# Patient Record
Sex: Female | Born: 1985 | Race: Black or African American | Hispanic: No | Marital: Single | Smoking: Never smoker
Health system: Southern US, Community
[De-identification: ages and names within clinical notes are randomized; demographics above are authoritative.]

## PROBLEM LIST (undated history)

## (undated) DIAGNOSIS — F32A Depression, unspecified: Secondary | ICD-10-CM

## (undated) DIAGNOSIS — R7303 Prediabetes: Secondary | ICD-10-CM

## (undated) DIAGNOSIS — Z9109 Other allergy status, other than to drugs and biological substances: Secondary | ICD-10-CM

## (undated) DIAGNOSIS — F329 Major depressive disorder, single episode, unspecified: Secondary | ICD-10-CM

## (undated) DIAGNOSIS — G4730 Sleep apnea, unspecified: Secondary | ICD-10-CM

## (undated) HISTORY — PX: LEG SURGERY: SHX1003

## (undated) HISTORY — PX: TOOTH EXTRACTION: SUR596

## (undated) HISTORY — DX: Sleep apnea, unspecified: G47.30

---
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
## 2006-03-09 ENCOUNTER — Emergency Department (HOSPITAL_COMMUNITY): Admission: EM | Admit: 2006-03-09 | Discharge: 2006-03-09 | Payer: Self-pay | Admitting: Emergency Medicine

## 2006-11-21 ENCOUNTER — Inpatient Hospital Stay (HOSPITAL_COMMUNITY): Admission: AD | Admit: 2006-11-21 | Discharge: 2006-11-21 | Payer: Self-pay | Admitting: Obstetrics

## 2007-02-18 ENCOUNTER — Inpatient Hospital Stay (HOSPITAL_COMMUNITY): Admission: AD | Admit: 2007-02-18 | Discharge: 2007-02-24 | Payer: Self-pay | Admitting: Obstetrics

## 2007-03-20 ENCOUNTER — Emergency Department (HOSPITAL_COMMUNITY): Admission: EM | Admit: 2007-03-20 | Discharge: 2007-03-20 | Payer: Self-pay | Admitting: Emergency Medicine

## 2007-10-04 ENCOUNTER — Emergency Department (HOSPITAL_COMMUNITY): Admission: EM | Admit: 2007-10-04 | Discharge: 2007-10-04 | Payer: Self-pay | Admitting: Emergency Medicine

## 2009-04-01 ENCOUNTER — Emergency Department (HOSPITAL_COMMUNITY): Admission: EM | Admit: 2009-04-01 | Discharge: 2009-04-01 | Payer: Self-pay | Admitting: Emergency Medicine

## 2009-11-06 ENCOUNTER — Emergency Department (HOSPITAL_COMMUNITY): Admission: EM | Admit: 2009-11-06 | Discharge: 2009-11-06 | Payer: Self-pay | Admitting: Emergency Medicine

## 2010-03-25 ENCOUNTER — Inpatient Hospital Stay (INDEPENDENT_AMBULATORY_CARE_PROVIDER_SITE_OTHER)
Admission: RE | Admit: 2010-03-25 | Discharge: 2010-03-25 | Disposition: A | Discharge status: Home / Self Care | Payer: Self-pay | Source: Ambulatory Visit | Attending: Emergency Medicine | Admitting: Emergency Medicine

## 2010-03-25 ENCOUNTER — Ambulatory Visit (INDEPENDENT_AMBULATORY_CARE_PROVIDER_SITE_OTHER): Payer: Self-pay

## 2010-03-25 DIAGNOSIS — R0789 Other chest pain: Secondary | ICD-10-CM

## 2010-03-25 DIAGNOSIS — S239XXA Sprain of unspecified parts of thorax, initial encounter: Secondary | ICD-10-CM

## 2010-03-25 LAB — POCT I-STAT, CHEM 8
BUN: 14 mg/dL (ref 6–23)
Calcium, Ion: 1.19 mmol/L (ref 1.12–1.32)
Chloride: 105 mEq/L (ref 96–112)
Creatinine, Ser: 0.7 mg/dL (ref 0.4–1.2)
Glucose, Bld: 103 mg/dL — ABNORMAL HIGH (ref 70–99)
HCT: 39 % (ref 36.0–46.0)
Hemoglobin: 13.3 g/dL (ref 12.0–15.0)
Potassium: 3.9 mEq/L (ref 3.5–5.1)
Sodium: 142 mEq/L (ref 135–145)
TCO2: 24 mmol/L (ref 0–100)

## 2010-03-25 LAB — D-DIMER, QUANTITATIVE: D-Dimer, Quant: <0.22 ug/mL-FEU (ref 0.00–0.48)

## 2010-04-09 LAB — URINALYSIS, ROUTINE W REFLEX MICROSCOPIC
Bilirubin Urine: NEGATIVE
Glucose, UA: NEGATIVE mg/dL
Hgb urine dipstick: NEGATIVE
Ketones, ur: NEGATIVE mg/dL
Nitrite: NEGATIVE
Protein, ur: NEGATIVE mg/dL
Specific Gravity, Urine: 1.026 (ref 1.005–1.030)
Urobilinogen, UA: 0.2 mg/dL (ref 0.0–1.0)
pH: 6 (ref 5.0–8.0)

## 2010-04-09 LAB — CBC
HCT: 35.6 % — ABNORMAL LOW (ref 36.0–46.0)
Hemoglobin: 11.9 g/dL — ABNORMAL LOW (ref 12.0–15.0)
MCH: 29.1 pg (ref 26.0–34.0)
MCHC: 33.4 g/dL (ref 30.0–36.0)
MCV: 87 fL (ref 78.0–100.0)
Platelets: 284 10*3/uL (ref 150–400)
RBC: 4.09 MIL/uL (ref 3.87–5.11)
RDW: 13.3 % (ref 11.5–15.5)
WBC: 9.4 10*3/uL (ref 4.0–10.5)

## 2010-04-09 LAB — URINE CULTURE
Colony Count: 60000
Culture  Setup Time: 201110132328

## 2010-04-09 LAB — COMPREHENSIVE METABOLIC PANEL
ALT: 17 U/L (ref 0–35)
AST: 17 U/L (ref 0–37)
Albumin: 3.7 g/dL (ref 3.5–5.2)
Alkaline Phosphatase: 61 U/L (ref 39–117)
BUN: 14 mg/dL (ref 6–23)
CO2: 26 mEq/L (ref 19–32)
Calcium: 9.2 mg/dL (ref 8.4–10.5)
Chloride: 105 mEq/L (ref 96–112)
Creatinine, Ser: 0.73 mg/dL (ref 0.4–1.2)
GFR calc Af Amer: >60 mL/min (ref >60)
GFR calc non Af Amer: >60 mL/min (ref >60)
Glucose, Bld: 87 mg/dL (ref 70–99)
Potassium: 3.9 mEq/L (ref 3.5–5.1)
Sodium: 136 mEq/L (ref 135–145)
Total Bilirubin: 0.7 mg/dL (ref 0.3–1.2)
Total Protein: 7.5 g/dL (ref 6.0–8.3)

## 2010-04-09 LAB — DIFFERENTIAL
Basophils Absolute: 0 10*3/uL (ref 0.0–0.1)
Basophils Relative: 0 % (ref 0–1)
Eosinophils Absolute: 0.2 10*3/uL (ref 0.0–0.7)
Eosinophils Relative: 2 % (ref 0–5)
Lymphocytes Relative: 38 % (ref 12–46)
Lymphs Abs: 3.6 10*3/uL (ref 0.7–4.0)
Monocytes Absolute: 0.6 10*3/uL (ref 0.1–1.0)
Monocytes Relative: 6 % (ref 3–12)
Neutro Abs: 5 10*3/uL (ref 1.7–7.7)
Neutrophils Relative %: 54 % (ref 43–77)

## 2010-04-09 LAB — WET PREP, GENITAL
Trich, Wet Prep: NONE SEEN
Yeast Wet Prep HPF POC: NONE SEEN

## 2010-04-09 LAB — LIPASE, BLOOD: Lipase: 24 U/L (ref 11–59)

## 2010-04-09 LAB — URINE MICROSCOPIC-ADD ON

## 2010-04-09 LAB — GC/CHLAMYDIA PROBE AMP, GENITAL
Chlamydia, DNA Probe: NEGATIVE
GC Probe Amp, Genital: NEGATIVE

## 2010-04-09 LAB — POCT PREGNANCY, URINE: Preg Test, Ur: NEGATIVE

## 2010-04-13 ENCOUNTER — Emergency Department (HOSPITAL_COMMUNITY)
Admission: EM | Admit: 2010-04-13 | Discharge: 2010-04-13 | Disposition: A | Discharge status: Home / Self Care | Payer: No Typology Code available for payment source | Attending: Emergency Medicine | Admitting: Emergency Medicine

## 2010-04-13 DIAGNOSIS — IMO0002 Reserved for concepts with insufficient information to code with codable children: Secondary | ICD-10-CM | POA: Insufficient documentation

## 2010-04-13 DIAGNOSIS — M25529 Pain in unspecified elbow: Secondary | ICD-10-CM | POA: Insufficient documentation

## 2010-05-11 ENCOUNTER — Ambulatory Visit (INDEPENDENT_AMBULATORY_CARE_PROVIDER_SITE_OTHER): Payer: No Typology Code available for payment source

## 2010-05-11 ENCOUNTER — Inpatient Hospital Stay (INDEPENDENT_AMBULATORY_CARE_PROVIDER_SITE_OTHER)
Admission: RE | Admit: 2010-05-11 | Discharge: 2010-05-11 | Disposition: A | Discharge status: Home / Self Care | Payer: No Typology Code available for payment source | Source: Ambulatory Visit | Attending: Emergency Medicine | Admitting: Emergency Medicine

## 2010-05-11 DIAGNOSIS — J209 Acute bronchitis, unspecified: Secondary | ICD-10-CM

## 2010-05-11 DIAGNOSIS — J40 Bronchitis, not specified as acute or chronic: Secondary | ICD-10-CM

## 2010-05-11 DIAGNOSIS — G43019 Migraine without aura, intractable, without status migrainosus: Secondary | ICD-10-CM

## 2010-06-09 NOTE — H&P (Signed)
Jenna Blair, Jenna Blair              ACCOUNT NO.:  [id number]   MEDICAL RECORD NO.:  [id number]          PATIENT TYPE:  INP   LOCATION:  9163                          FACILITY:  [facility]   PHYSICIAN:  Roseanna Rainbow, M.D.DATE OF BIRTH:  October 08, 1985   DATE OF ADMISSION:  02/18/2007  DATE OF DISCHARGE:                              HISTORY & PHYSICAL   CHIEF COMPLAINT:  The patient is a 25 year old gravida 1, para 0 with an  estimated date of confinement of February 17, 2007, with an intrauterine  pregnancy 40+ weeks complaining of passing her mucus plug.   HISTORY OF PRESENT ILLNESS:  Please see the above.   PAST GYN HISTORY:  Noncontributory.   PAST MEDICAL HISTORY:  She denies.   PAST SURGICAL HISTORY:  She denies.   FAMILY HISTORY:  Noncontributory.   SOCIAL HISTORY:  No tobacco, ethanol or drug use.   PRENATAL LABORATORIES:  Hemoglobin 12.2; hematocrit 35.8; platelets  268,000.  Blood type O positive, antibody screen negative.  Sickle cell  trait negative.  RPR nonreactive.  Rubella immune.  Hepatitis B surface  antigen negative.  HIV nonreactive.  PPD negative.  Pap test negative.  GC probe negative.  Chlamydia probe negative.  One-hour GCT 97.  Quad  screen declined.  Ultrasound on August 25:  Estimated date of  confinement January 23 at 18 weeks 3 days, posterior placenta.   PHYSICAL EXAMINATION:  Vital signs stable, afebrile.  Fetal heart  tracing initially baseline of 160s with increase variability, now with a  baseline of 140, average long-term variability.  Sterile vaginal exam  per the RN:  The cervix is 1 and 80% effaced.   ASSESSMENT:  1. Primigravida at term.  2. Fetal tachycardia, now resolved.  3. Prodromal labor.   PLAN:  Admission, augmentation of labor, low-dose Pitocin per protocol.      Roseanna Rainbow, M.D.  Electronically Signed    LAJ/MEDQ  D:  02/18/2007  T:  02/19/2007  Job:  [id number]

## 2010-06-09 NOTE — Discharge Summary (Signed)
NAMEHAEVYN, URY              ACCOUNT NO.:  [id number]   MEDICAL RECORD NO.:  [id number]          PATIENT TYPE:  INP   LOCATION:  9120                          [facility]ILITY:  [facility]   PHYSICIAN:  Kathreen Cosier, M.D.DATE OF BIRTH:  October 26, 1985   DATE OF ADMISSION:  02/18/2007  DATE OF DISCHARGE:  02/24/2007                               DISCHARGE SUMMARY   The patient is a 25 year old primigravida, EDC of February 17, 2007.  She  was admitted in labor, and she underwent a primary low transverse  cesarean section, because of failure to progress in labor.  Post-op she  did well.  Day one, her hemoglobin was 9, and day 3 her temperature was  up to 100.9.  Incision was clean.  She was started on Cleocin 300 p.o.  q.6 hours, then she rapidly defervesced. White count 17, platelets 192.  RPR negative.   DISCHARGE:  She was discharged on the fourth postoperative day,  ambulatory, on regular diet, to see me in 6 weeks.   DISCHARGE MEDICATION:  Tylox and ferrous sulfate.   DISCHARGE DIAGNOSIS:  Status post primary low transverse cesarean  section at term.           ______________________________  Kathreen Cosier, M.D.     BAM/MEDQ  D:  03/08/2007  T:  03/09/2007  Job:  [id number]

## 2010-06-09 NOTE — Op Note (Signed)
NAMESCARLET, Jenna Blair              ACCOUNT NO.:  [id number]   MEDICAL RECORD NO.:  [id number]          PATIENT TYPE:  INP   LOCATION:  9120                          [facility]ILITY[facility]  [facility]   PHYSICIAN:  Roseanna Rainbow, M.D.DATE OF BIRTH:  March 28, 1985   DATE OF PROCEDURE:  02/20/2007  DATE OF DISCHARGE:                               OPERATIVE REPORT   PREOPERATIVE DIAGNOSIS:  Intrauterine pregnancy at term, arrest of  dilatation, active phase of labor, chorioamnionitis.   POSTOPERATIVE DIAGNOSIS:  Intrauterine pregnancy at term, arrest of  dilatation, active phase of labor, chorioamnionitis.   PROCEDURE:  Primary low uterine flap elliptical cesarean delivery via  Pfannenstiel skin incision.   SURGEON:  Antionette Char, M.D.   ANESTHESIA:  Epidural.   ESTIMATED BLOOD LOSS:  600 mL.   Urine output and IV fluid as per anesthesiology.  Please note that the  urine was blood tinged prior to the start of the procedure.   PROCEDURE:  The patient was taken to the operating room with an IV  running and an epidural catheter in situ.  She was placed in the dorsal  supine position with a leftward tilt and prepped and draped in the usual  sterile fashion.  After time-out had been completed, a Pfannenstiel skin  incision was made with a scalpel and carried down to the underlying  fascia.  The fascia was nicked in the midline.  The fascial incision was  then extended bilaterally.  The superior aspect of the fascial incision  was tented up and the underlying rectus muscles dissected off.  The  inferior aspect of the fascial incision was manipulated in a similar  fashion.  The rectus muscles were separated in the midline.  The  parietal peritoneum was tented up and entered sharply.  This incision  was then extended superiorly and inferiorly with good visualization of  the bladder.  The bladder blade was then placed.  The vesicouterine  peritoneum was tented up and entered sharply.  This  incision was then  extended bilaterally and the bladder flap created bluntly.  The bladder  blade was then replaced.  The lower uterine segment was incised in a  transverse fashion with a scalpel.  The incision was extended bluntly.  The infant's head was delivered atraumatically.  The oropharynx was  suctioned with bulb suction.  The cord was clamped and cut.  The infant  was handed off to the waiting neonatal neonatologist.  The Apgars were 7  and 9 at one and five minutes, respectively.  The placenta was then  removed.  The intrauterine cavity was evacuated of any remaining  amniotic fluid, clots and debris with moist laparotomy sponge.  The  uterine incision was then reapproximated in a running interlocking  fashion using a suture of 0-0 Monocryl.  A second imbricating layer of  the same suture was then placed.  Adequate hemostasis was noted.  The  paracolic gutters were then irrigated.  The parietal peritoneum was  reapproximated in a running fashion using 2-  0 Vicryl.  The fascia was closed in a running fashion using 0-0 Vicryl.  The skin was closed with staples.  At the close of the procedure, the  instrument and pack counts were said to be correct x2.  The patient was  taken to the PACU awake and in stable condition.      Roseanna Rainbow, M.D.  Electronically Signed     LAJ/MEDQ  D:  02/20/2007  T:  02/20/2007  Job:  [id number]   cc:   Kathreen Cosier, M.D.  Fax: [phone]

## 2010-10-15 LAB — RPR: RPR Ser Ql: NONREACTIVE

## 2010-10-15 LAB — CBC
HCT: 26.3 — ABNORMAL LOW
HCT: 30.4 — ABNORMAL LOW
Hemoglobin: 10.4 — ABNORMAL LOW
Hemoglobin: 9 — ABNORMAL LOW
MCHC: 34.3
MCHC: 34.3
MCV: 84.4
MCV: 84.9
Platelets: 192
Platelets: 232
RBC: 3.12 — ABNORMAL LOW
RBC: 3.58 — ABNORMAL LOW
RDW: 15.9 — ABNORMAL HIGH
RDW: 16.2 — ABNORMAL HIGH
WBC: 17 — ABNORMAL HIGH
WBC: 8.5

## 2010-10-28 LAB — POCT RAPID STREP A: Streptococcus, Group A Screen (Direct): NEGATIVE

## 2010-10-28 LAB — POCT INFECTIOUS MONO SCREEN: Mono Screen: NEGATIVE

## 2010-11-04 LAB — URINALYSIS, ROUTINE W REFLEX MICROSCOPIC
Bilirubin Urine: NEGATIVE
Glucose, UA: NEGATIVE
Hgb urine dipstick: NEGATIVE
Ketones, ur: NEGATIVE
Nitrite: NEGATIVE
Protein, ur: NEGATIVE
Specific Gravity, Urine: 1.025
Urobilinogen, UA: 0.2
pH: 6.5

## 2011-07-11 ENCOUNTER — Encounter (HOSPITAL_COMMUNITY): Payer: Self-pay | Admitting: *Deleted

## 2011-07-11 ENCOUNTER — Emergency Department (HOSPITAL_COMMUNITY)
Admission: EM | Admit: 2011-07-11 | Discharge: 2011-07-11 | Disposition: A | Discharge status: Home / Self Care | Payer: Self-pay | Source: Home / Self Care | Attending: Emergency Medicine | Admitting: Emergency Medicine

## 2011-07-11 DIAGNOSIS — J029 Acute pharyngitis, unspecified: Secondary | ICD-10-CM

## 2011-07-11 LAB — POCT RAPID STREP A: Streptococcus, Group A Screen (Direct): NEGATIVE

## 2011-07-11 MED ORDER — ACETAMINOPHEN-CODEINE #3 300-30 MG PO TABS: 1.0000 | ORAL_TABLET | Freq: Four times a day (QID) | ORAL | Status: AC | PRN

## 2011-07-11 MED ORDER — AZITHROMYCIN 250 MG PO TABS: 250.0000 mg | ORAL_TABLET | Freq: Every day | ORAL | Status: AC

## 2011-07-11 NOTE — ED Notes (Signed)
Co sorethroat x 5 days, has tried ibuprofen and gargling with little improvement.

## 2011-07-11 NOTE — ED Provider Notes (Signed)
History     CSN: [id number]  Arrival date & time 07/11/11  1118   First MD Initiated Contact with Patient 07/11/11 1119      Chief Complaint  Patient presents with  . Sore Throat    (Consider location/radiation/quality/duration/timing/severity/associated sxs/prior treatment) HPI Comments: Patient presents urgent care complaining of a sore throat for about 5 days. Worse this morning. Some mild to moderate nasal congestion. She has been taking ibuprofen performing salt water gargles with little improvement. Patient denies any fevers, generalized malaise. Cough or shortness of breath.  Patient is a 26 y.o. female presenting with pharyngitis. The history is provided by the patient.  Sore Throat This is a new problem. The current episode started more than 2 days ago. The problem occurs constantly. The problem has been gradually worsening. Pertinent negatives include no chest pain, no abdominal pain, no headaches and no shortness of breath. The symptoms are aggravated by swallowing. She has tried nothing for the symptoms. The treatment provided no relief.    History reviewed. No pertinent past medical history.  History reviewed. No pertinent past surgical history.  History reviewed. No pertinent family history.  History  Substance Use Topics  . Smoking status: Never Smoker   . Smokeless tobacco: Not on file  . Alcohol Use: Yes    OB History    Grav Para Term Preterm Abortions TAB SAB Ect Mult Living                  Review of Systems  Constitutional: Negative for fever, activity change and appetite change.  HENT: Positive for congestion, sore throat and postnasal drip. Negative for ear pain, facial swelling, rhinorrhea, drooling, mouth sores, trouble swallowing, neck pain, neck stiffness and dental problem.   Respiratory: Negative for cough, shortness of breath and wheezing.   Cardiovascular: Negative for chest pain.  Gastrointestinal: Negative for abdominal pain.    Neurological: Negative for numbness and headaches.    Allergies  Review of patient's allergies indicates no known allergies.  Home Medications   Current Outpatient Rx  Name Route Sig Dispense Refill  . ETONOGESTREL 68 MG [place] IMPL Subcutaneous Inject 1 each into the skin once.    . ACETAMINOPHEN-CODEINE #3 300-30 MG PO TABS Oral Take 1-2 tablets by mouth every 6 (six) hours as needed for pain. 15 tablet 0  . AZITHROMYCIN 250 MG PO TABS Oral Take 1 tablet (250 mg total) by mouth daily. Take first 2 tablets together, then 1 every day until finished. 6 tablet 0    BP 138/87  Pulse 85  Temp 99.3 F (37.4 C) (Oral)  Resp 20  SpO2 100%  Physical Exam  Nursing note and vitals reviewed. Constitutional: She appears well-developed and well-nourished.  Non-toxic appearance. She does not have a sickly appearance. She does not appear ill. No distress.  HENT:  Head: Normocephalic.  Right Ear: Tympanic membrane normal.  Left Ear: Tympanic membrane normal.  Mouth/Throat: Uvula is midline and mucous membranes are normal. Posterior oropharyngeal erythema present. No oropharyngeal exudate or tonsillar abscesses.  Eyes: Conjunctivae are normal.  Neck: Neck supple. No JVD present. No tracheal tenderness and no spinous process tenderness present. No tracheal deviation and normal range of motion present. No Brudzinski's sign and no Kernig's sign noted. No thyromegaly present.  Pulmonary/Chest: Effort normal. No stridor. No respiratory distress. She has no wheezes. She has no rales. She exhibits no tenderness.  Skin: No rash noted.    ED Course  Procedures (including critical care time)  Labs Reviewed  POCT RAPID STREP A ([facility] ONLY)   No results found.   1. Pharyngitis       MDM   Exudative pharyngitis with cervical reactive lymphadenopathy. Patient was started on antibiotics, discussed symptoms that would warrant her return for followup     Jimmie Molly, MD 07/11/11 1255

## 2011-07-11 NOTE — Discharge Instructions (Signed)
Follow-up with your doctor or [place] if symptoms persist beyond 7 days. Start with his prescribed medicines to help you with your discomfort and this antibiotic. Should be feeling better in the next 48 hours.    Pharyngitis, Viral and Bacterial Pharyngitis is soreness (inflammation) or infection of the pharynx. It is also called a sore throat. CAUSES  Most sore throats are caused by viruses and are part of a cold. However, some sore throats are caused by strep and other bacteria. Sore throats can also be caused by post nasal drip from draining sinuses, allergies and sometimes from sleeping with an open mouth. Infectious sore throats can be spread from person to person by coughing, sneezing and sharing cups or eating utensils. TREATMENT  Sore throats that are viral usually last 3-4 days. Viral illness will get better without medications (antibiotics). Strep throat and other bacterial infections will usually begin to get better about 24-48 hours after you begin to take antibiotics. HOME CARE INSTRUCTIONS   If the caregiver feels there is a bacterial infection or if there is a positive strep test, they will prescribe an antibiotic. The full course of antibiotics must be taken. If the full course of antibiotic is not taken, you or your child may become ill again. If you or your child has strep throat and do not finish all of the medication, serious heart or kidney diseases may develop.   Drink enough water and fluids to keep your urine clear or pale yellow.   Only take over-the-counter or prescription medicines for pain, discomfort or fever as directed by your caregiver.   Get lots of rest.   Gargle with salt water ( tsp. of salt in a glass of water) as often as every 1-2 hours as you need for comfort.   Hard candies may soothe the throat if individual is not at risk for choking. Throat sprays or lozenges may also be used.  SEEK MEDICAL CARE IF:   Large, tender lumps in the neck develop.   A rash  develops.   Green, yellow-brown or bloody sputum is coughed up.   Your baby is older than 3 months with a rectal temperature of 100.5 F (38.1 C) or higher for more than 1 day.  SEEK IMMEDIATE MEDICAL CARE IF:   A stiff neck develops.   You or your child are drooling or unable to swallow liquids.   You or your child are vomiting, unable to keep medications or liquids down.   You or your child has severe pain, unrelieved with recommended medications.   You or your child are having difficulty breathing (not due to stuffy nose).   You or your child are unable to fully open your mouth.   You or your child develop redness, swelling, or severe pain anywhere on the neck.   You have a fever.   Your baby is older than 3 months with a rectal temperature of 102 F (38.9 C) or higher.   Your baby is 107 months old or younger with a rectal temperature of 100.4 F (38 C) or higher.  MAKE SURE YOU:   Understand these instructions.   Will watch your condition.   Will get help right away if you are not doing well or get worse.  Document Released: 01/11/2005 Document Revised: 12/31/2010 Document Reviewed: 04/10/2007 [facility] Patient Information 2012 [place], [place].

## 2012-05-07 ENCOUNTER — Encounter (HOSPITAL_COMMUNITY): Payer: Self-pay | Admitting: Emergency Medicine

## 2012-05-07 ENCOUNTER — Other Ambulatory Visit (HOSPITAL_COMMUNITY)
Admission: RE | Admit: 2012-05-07 | Discharge: 2012-05-07 | Disposition: A | Discharge status: Home / Self Care | Payer: BC Managed Care – PPO | Source: Ambulatory Visit | Attending: Family Medicine | Admitting: Family Medicine

## 2012-05-07 ENCOUNTER — Emergency Department (HOSPITAL_COMMUNITY)
Admission: EM | Admit: 2012-05-07 | Discharge: 2012-05-07 | Disposition: A | Discharge status: Home / Self Care | Payer: BC Managed Care – PPO | Source: Home / Self Care

## 2012-05-07 DIAGNOSIS — Z2089 Contact with and (suspected) exposure to other communicable diseases: Secondary | ICD-10-CM

## 2012-05-07 DIAGNOSIS — Z113 Encounter for screening for infections with a predominantly sexual mode of transmission: Secondary | ICD-10-CM | POA: Insufficient documentation

## 2012-05-07 DIAGNOSIS — A630 Anogenital (venereal) warts: Secondary | ICD-10-CM

## 2012-05-07 DIAGNOSIS — Z202 Contact with and (suspected) exposure to infections with a predominantly sexual mode of transmission: Secondary | ICD-10-CM

## 2012-05-07 DIAGNOSIS — N760 Acute vaginitis: Secondary | ICD-10-CM | POA: Insufficient documentation

## 2012-05-07 LAB — POCT URINALYSIS DIP (DEVICE)
Bilirubin Urine: NEGATIVE
Glucose, UA: NEGATIVE mg/dL
Hgb urine dipstick: NEGATIVE
Ketones, ur: NEGATIVE mg/dL
Leukocytes, UA: NEGATIVE
Nitrite: NEGATIVE
Protein, ur: NEGATIVE mg/dL
Specific Gravity, Urine: 1.015 (ref 1.005–1.030)
Urobilinogen, UA: 0.2 mg/dL (ref 0.0–1.0)
pH: 8.5 — ABNORMAL HIGH (ref 5.0–8.0)

## 2012-05-07 LAB — HIV ANTIBODY (ROUTINE TESTING W REFLEX): HIV: NONREACTIVE

## 2012-05-07 LAB — POCT PREGNANCY, URINE: Preg Test, Ur: NEGATIVE

## 2012-05-07 LAB — RPR: RPR Ser Ql: NONREACTIVE

## 2012-05-07 MED ORDER — PODOFILOX 0.5 % EX SOLN: Freq: Two times a day (BID) | CUTANEOUS | Status: DC

## 2012-05-07 NOTE — ED Provider Notes (Signed)
Medical screening examination/treatment/procedure(s) were performed by Dr. Corey and as supervising physician I was immediately available for consultation/collaboration.   Dreyah Montrose Moreno-Coll, MD  Plumer Mittelstaedt Moreno-Coll, MD 05/07/12 1734 

## 2012-05-07 NOTE — ED Notes (Signed)
Pt reports partner informed her that she needed to get tested because he may have gc/ct  Pt also has concerns of warts ? Around anal area.   Rash on hands that comes and goes with clear drainage.

## 2012-05-07 NOTE — ED Provider Notes (Signed)
Jenna Blair is a 27 y.o. female who presents to Urgent Care today for STD exposure and  genital warts. Patient's boyfriend recently tested positive for gonorrhea and Chlamydia. He advised her to seek evaluation and treatment. She denies any discharge abdominal pain fevers or chills and feels well otherwise. Additionally she notes when she is worried may be genital warts present for the last several months. She [facility] Pap [facility]. At that time they told her she may have total warts and to seek treatment with an OB/GYN. She recently obtained health insurance but does not yet have an OB/GYN Dr.  no fevers or chills or weight loss. No history of STD.  She does not have menstrual periods issues on Implanon.   PMH reviewed. As above History  Substance Use Topics  . Smoking status: Never Smoker   . Smokeless tobacco: Not on file  . Alcohol Use: Yes   ROS as above Medications reviewed. No current facility-administered medications for this encounter.   Current Outpatient Prescriptions  Medication Sig Dispense Refill  . etonogestrel (IMPLANON) 68 MG IMPL implant Inject 1 each into the skin once.      . podofilox (CONDYLOX) 0.5 % external solution Apply topically 2 (two) times daily. Apply twice daily (morning and evening) for 3 consecutive days, then withhold use for 4 consecutive days; this cycle may be repeated up to 4 times until there is no visible wart tissue; maximum dose: 0.5 mL daily  3.5 mL  0    Exam:  BP 106/70  Pulse 84  Temp(Src) 98.8 F (37.1 C) (Oral)  Resp 18  SpO2 100% Gen: Well NAD HEENT: EOMI,  MMM Lungs: CTABL Nl WOB Heart: RRR no MRG Abd: NABS, NT, ND Exts: Non edematous BL  LE, warm and well perfused.  Gen: External genitalia with 2 small hypopigmented masses one at the inferior left labia and one at the anus appearing to be genital warts.  Vaginal canal is normal-appearing with small amount of discharge.  Cervix is normal  appearing and nontender No adnexal masses or tenderness on bimanual exam.   Results for orders placed during the hospital encounter of 05/07/12 (from the past 24 hour(s))  POCT URINALYSIS DIP (DEVICE)     Status: Abnormal   Collection Time    05/07/12 12:56 PM      Result Value Range   Glucose, UA NEGATIVE  NEGATIVE mg/dL   Bilirubin Urine NEGATIVE  NEGATIVE   Ketones, ur NEGATIVE  NEGATIVE mg/dL   Specific Gravity, Urine 1.015  1.005 - 1.030   Hgb urine dipstick NEGATIVE  NEGATIVE   pH 8.5 (*) 5.0 - 8.0   Protein, ur NEGATIVE  NEGATIVE mg/dL   Urobilinogen, UA 0.2  0.0 - 1.0 mg/dL   Nitrite NEGATIVE  NEGATIVE   Leukocytes, UA NEGATIVE  NEGATIVE  POCT PREGNANCY, URINE     Status: None   Collection Time    05/07/12 12:59 PM      Result Value Range   Preg Test, Ur NEGATIVE  NEGATIVE   No results found.  Assessment and Plan: 27 y.o. female with STD exposure and genital warts.  Plan: Test for her gonorrhea, Chlamydia, HIV, syphilis today. Additionally treatment for genital warts with topical podofilox.  Followup with OB/GYN ASAP.  Discussed warning signs or symptoms. Please see discharge instructions. Patient expresses understanding.     Rodolph Bong, MD 05/07/12 [phone]

## 2012-05-09 ENCOUNTER — Telehealth: Payer: Self-pay | Admitting: Family Medicine

## 2012-05-09 ENCOUNTER — Telehealth (HOSPITAL_COMMUNITY): Payer: Self-pay | Admitting: *Deleted

## 2012-05-09 MED ORDER — METRONIDAZOLE 500 MG PO TABS: 500.0000 mg | ORAL_TABLET | Freq: Two times a day (BID) | ORAL | Status: DC

## 2012-05-09 NOTE — ED Notes (Signed)
GC/Clamydia neg., Affirm: Gardnerella pos., Candida and Trich neg. 4/14 Message to Dr. Denyse Amass. 4/15 Order for Flagyl done today by Dr. Denyse Amass. I called pt.  Pt. verified x 2.  Pt. states the doctor called her at 1330 and told her she had a bacterial infection and he sent a Rx. to the [facility] for her.  Pt. instructed to no alcohol while taking this medication. Pt. voiced understanding. I apologized for calling again. I told her I did not know the doctor had called her. Vassie Moselle 05/09/2012

## 2012-05-09 NOTE — Telephone Encounter (Signed)
Called about test results.  Sent in Flagyl for pharmacy.  F/u with OBGYN

## 2012-08-27 IMAGING — CR DG CHEST 2V
2 series · 2 of 2 positions shown · non-contrast
Comparison: Chest x-ray 03/25/2010.

CLINICAL DATA: Chest pain.  Cough.

CHEST - 2 VIEW

[view not recorded (1 of 2)]
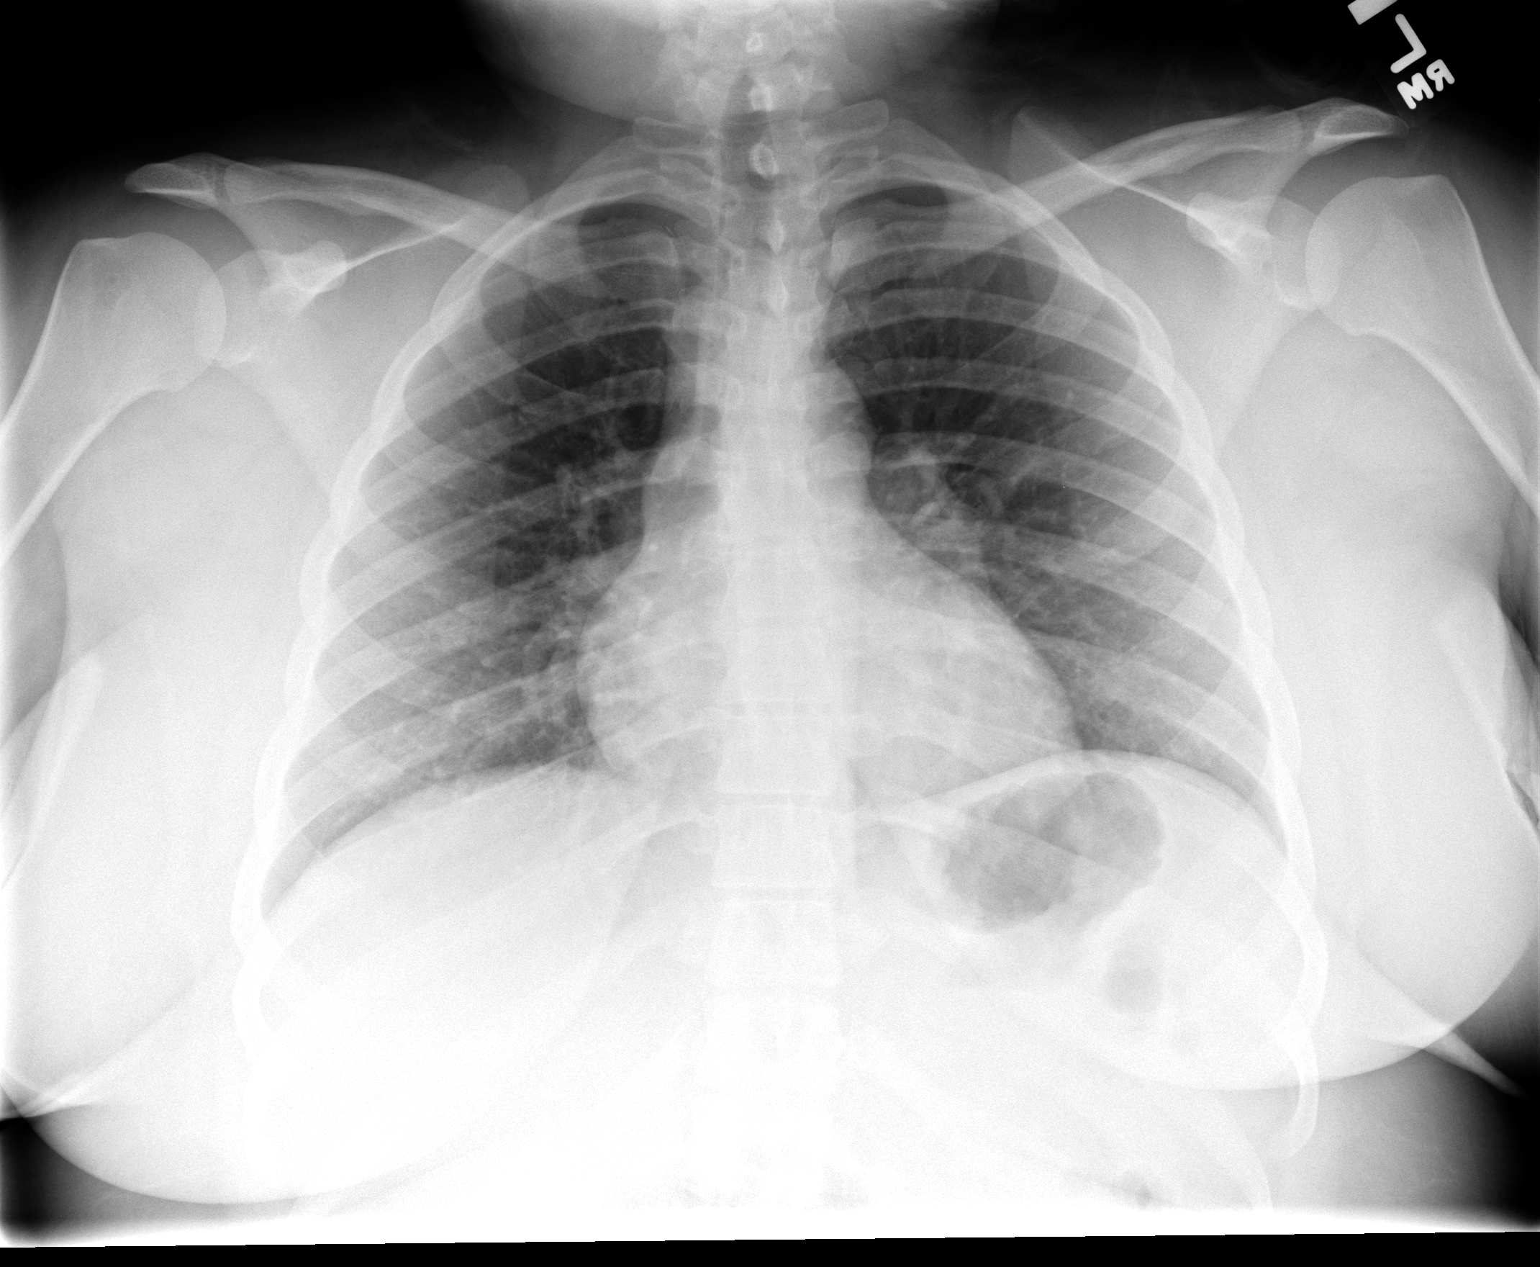

[view not recorded (2 of 2)]
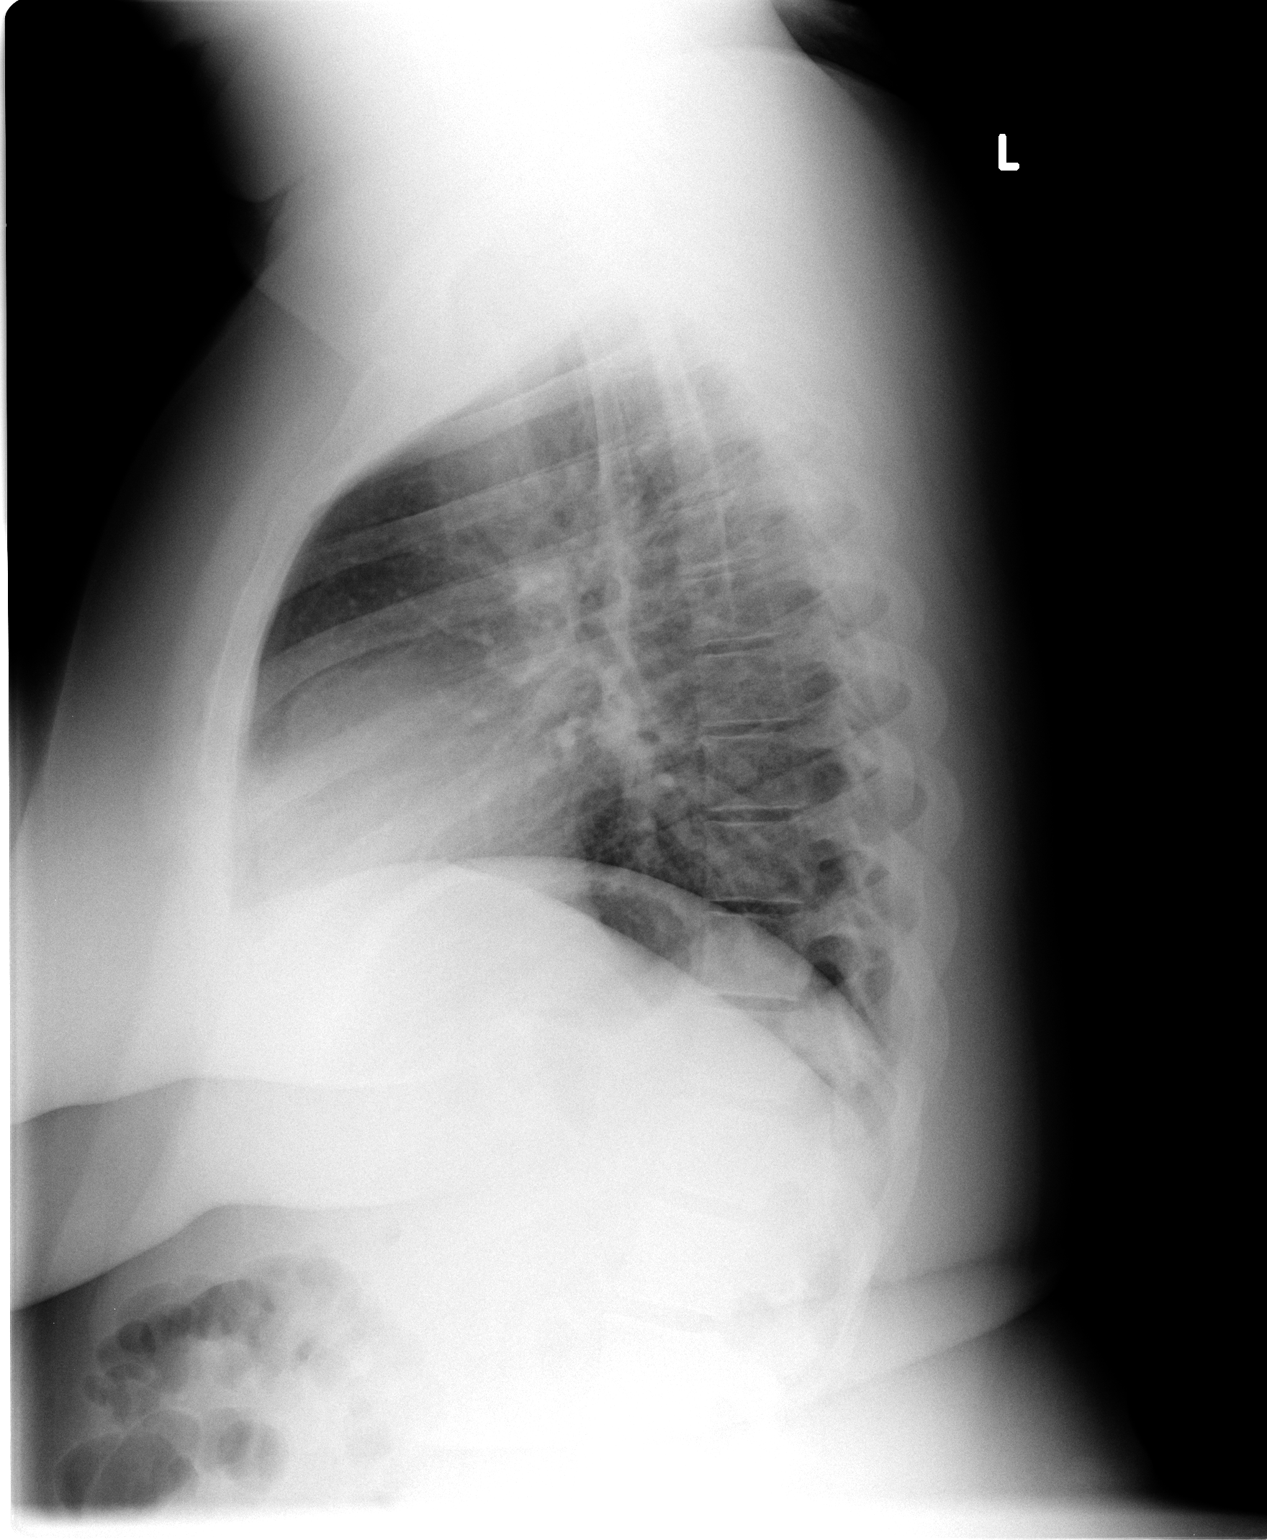

[2 of 2 positions shown; findings below may reference images not displayed]

FINDINGS: The cardiac silhouette, mediastinal and hilar contours
are within normal limits and stable.  There is mild peribronchial
thickening which may suggest bronchitis.  No focal infiltrates,
edema or effusions.  The bony thorax is intact
IMPRESSION: Peribronchial thickening may suggest bronchitis.

## 2014-07-22 ENCOUNTER — Encounter: Payer: BLUE CROSS/BLUE SHIELD | Attending: Internal Medicine | Admitting: Skilled Nursing Facility1

## 2014-07-22 ENCOUNTER — Encounter: Payer: Self-pay | Admitting: Skilled Nursing Facility1

## 2014-07-22 VITALS — Ht 60.0 in | Wt 258.0 lb

## 2014-07-22 DIAGNOSIS — E669 Obesity, unspecified: Secondary | ICD-10-CM

## 2014-07-22 DIAGNOSIS — Z6841 Body Mass Index (BMI) 40.0 and over, adult: Secondary | ICD-10-CM | POA: Insufficient documentation

## 2014-07-22 DIAGNOSIS — Z713 Dietary counseling and surveillance: Secondary | ICD-10-CM | POA: Diagnosis not present

## 2014-07-22 NOTE — Progress Notes (Signed)
  Medical Nutrition Therapy:  Appt start time: 10:00 end time:  11:00   Assessment:  Primary concerns today: referred for obesity. Pt states she wants to lose wt, after her first pregnancy it has been very hard to lose wt. Diet hx: before May 4 times a week exercise which resulted in lost inches; pt has tried hydroxy cut but it was keeping her from sleeping-for 2-3 weeks; Currently taking appetite suppressant June 3rd-has noticed she is not as hungry she will take it for 5 months. Pt states she just lost her grandmother so has not had the motivation to exercise.  Pt states she is constipated so she took a laxative which relieved it. Pt states her current wt is her usual wt but she does see her wt beginning to increase. Pt states she had her baby in 2009 and did not breast feed. Pts sleep: toss and turn all night. Pt states she eats in the living room in front of the Television. Pt states she forces herself to eat since she has been taking her appetite medicine-she can go all day without eating and get light headed. Pt states her moods have been funny and feels her moods have been all over the place, "I just don't care anymore." Pt states she will get a sleep study on July 20th. Pt states she has not been eating since-unable to determine. Pt states when she is ready to eat she feels like she is greedy and eats to much. Pt states her cousin is trying to help get her life in order and find her a therapist. Pt states she is having financial difficulties.  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  Pt states she felt defeated and started her depressed mood after her physician told her she has gained 10 pounds since her last appointment.   Preferred Learning Style:   Auditory  Visual  Learning Readiness:   Not ready  Contemplating  MEDICATIONS: See List   DIETARY INTAKE:  Usual eating pattern includes 1 meals and 0-1 snacks per day.  Everyday foods include none stated.  Avoided foods include none stated.     24-hr recall: Depends on mood  B ( AM): biscuit Snk ( AM): none L ( PM): whatever is served at work Snk ( PM): none D ( PM): none Snk ( PM): none Beverages: water  Usual physical activity: ADL's  Estimated energy needs: 2000 calories 225 g carbohydrates 150 g protein 56 g fat  Progress Towards Goal(s):  In progress.   Nutritional Diagnosis:  NB-1.1 Food and nutrition-related knowledge deficit As related to no prior nutrition education from a professional.  As evidenced by not eating throughout the day, harmful beliefs leading to not eating, and BMI 50.39.    Intervention:  Nutrition counseling for obesity. Dietitian educated the pt on the importance of eating, what each macronutrient was important for, to only utilize legitimate evidence based nutrition information, and to be physically active everyday.  Dietitian provided the pt with a list of counselors and the insurance they take as well as the crisis hot line card to use in an emergency situation.  Teaching Method Utilized:  Visual Auditory  Handouts given during visit include:  Snack handout  MyPlate  Barriers to learning/adherence to lifestyle change: unhealthy relationship with food  Demonstrated degree of understanding via:  Teach Back   Monitoring/Evaluation:  Dietary intake, exercise, relationship with food, and body weight prn.

## 2014-08-14 ENCOUNTER — Ambulatory Visit (HOSPITAL_BASED_OUTPATIENT_CLINIC_OR_DEPARTMENT_OTHER): Payer: BLUE CROSS/BLUE SHIELD | Attending: Obstetrics | Admitting: Radiology

## 2014-08-14 VITALS — Ht 60.0 in | Wt 258.0 lb

## 2014-08-14 DIAGNOSIS — R0683 Snoring: Secondary | ICD-10-CM | POA: Insufficient documentation

## 2014-08-14 DIAGNOSIS — G4733 Obstructive sleep apnea (adult) (pediatric): Secondary | ICD-10-CM | POA: Insufficient documentation

## 2014-08-14 DIAGNOSIS — G4730 Sleep apnea, unspecified: Secondary | ICD-10-CM

## 2014-08-17 DIAGNOSIS — G4730 Sleep apnea, unspecified: Secondary | ICD-10-CM | POA: Diagnosis not present

## 2014-08-17 NOTE — Progress Notes (Signed)
  Patient Name: Jenna Blair, Jenna Blair Date: 08/14/2014 Gender: Female D.O.B: 1985-04-26 Age (years): 28 Referring Provider: Francoise Ceo, MD Height (inches): 60 Interpreting Physician: Jetty Duhamel MD, ABSM Weight (lbs): 258 RPSGT: Shelah Lewandowsky BMI: 50 MRN: [id number] Neck Size: 15.00 CLINICAL INFORMATION Sleep Study Type: NPSG  Indication for sleep study: Depression, Morning Headaches, Obesity, Snoring, Witnesses Apnea / Gasping During Sleep  Epworth Sleepiness Score: 11  SLEEP STUDY TECHNIQUE As per the AASM Manual for the Scoring of Sleep and Associated Events v2.3 (April 2016) with a hypopnea requiring 4% desaturations.  The channels recorded and monitored were frontal, central and occipital EEG, electrooculogram (EOG), submentalis EMG (chin), nasal and oral airflow, thoracic and abdominal wall motion, anterior tibialis EMG, snore microphone, electrocardiogram, and pulse oximetry.  MEDICATIONS Patient's medications include: Charted for review. Medications self-administered by patient during sleep study : No sleep medicine administered.  SLEEP ARCHITECTURE The study was initiated at 10:27:54 PM and ended at 4:52:57 AM.  Sleep onset time was 101.7 minutes and the sleep efficiency was 47.9%. The total sleep time was 184.5 minutes.  Stage REM latency was 145.5 minutes.  The patient spent 13.28% of the night in stage N1 sleep, 69.38% in stage N2 sleep, 10.57% in stage N3 and 6.78% in REM.  Alpha intrusion was absent.  Supine sleep was 12.19 %  Awake after sleep onset 98.8 minutes  RESPIRATORY PARAMETERS The overall apnea/hypopnea index (AHI) was 11.7 per hour. There were 0 total apneas, including 0 obstructive, 0 central and 0 mixed apneas. There were 36 hypopneas and 53 RERAs.  The AHI during Stage REM sleep was 19.2 per hour.  AHI while supine was 32.0 per hour.  The mean oxygen saturation was 96.89%. The minimum SpO2 during sleep was 88.00%.  Moderate  snoring was noted during this study.  CARDIAC DATA The 2 lead EKG demonstrated sinus rhythm. The mean heart rate was 64.32 beats per minute. Other EKG findings include: None.   Movement/ Parasomnia The total PLMS were 0 with a resulting PLMS index of 0.00. Associated arousal with leg movement index was 0.0 .  IMPRESSIONS Mild obstructive sleep apnea occurred during this study (AHI = 11.7/h). This study was ordered as a diagnostic polysomnogram without CPAP No significant central sleep apnea occurred during this study (CAI = 0.0/h). The patient had minimal or no oxygen desaturation during the study (Min O2 = 88.00%) The patient snored with Moderate snoring volume. No cardiac abnormalities were noted during this study. Clinically significant periodic limb movements did not occur during sleep. No significant associated arousals. Somniloquy/ sleep talking was noted  DIAGNOSIS Obstructive Sleep Apnea (327.23 [G47.33 ICD-10])  RECOMMENDATIONS CPAP or an oral appliance would usually be considered appropriate for scores in this range The patient can be scheduled for CPAP titration or referred for Sleep Medicine consultation and management if desired. Positional therapy avoiding supine position during sleep. Avoid alcohol, sedatives and other CNS depressants that may worsen sleep apnea and disrupt normal sleep architecture. Sleep hygiene should be reviewed to assess factors that may improve sleep quality. Weight management and regular exercise should be initiated or continued if appropriate.  Waymon Budge Diplomate, American Board of Sleep Medicine  ELECTRONICALLY SIGNED ON:  08/17/2014, 2:04 PM [facility] PH: [phone]   FX: [phone] ACCREDITED BY THE AMERICAN ACADEMY OF SLEEP MEDICINE

## 2014-10-22 ENCOUNTER — Ambulatory Visit (INDEPENDENT_AMBULATORY_CARE_PROVIDER_SITE_OTHER): Payer: BLUE CROSS/BLUE SHIELD | Admitting: Pulmonary Disease

## 2014-10-22 ENCOUNTER — Encounter: Payer: Self-pay | Admitting: Pulmonary Disease

## 2014-10-22 VITALS — BP 110/82 | HR 76 | Ht 60.0 in | Wt 244.2 lb

## 2014-10-22 DIAGNOSIS — Z23 Encounter for immunization: Secondary | ICD-10-CM | POA: Diagnosis not present

## 2014-10-22 DIAGNOSIS — G4733 Obstructive sleep apnea (adult) (pediatric): Secondary | ICD-10-CM

## 2014-10-22 NOTE — Patient Instructions (Signed)
You have mild obstructive sleep apnea - stopped breathing 12/hr Weight loss would help Meanwhile, start CPAP therapy

## 2014-10-22 NOTE — Progress Notes (Signed)
   Subjective:    Patient ID: Jenna Blair, female    DOB: 1985-08-20, 29 y.o.   MRN: [id number]  HPI 29 year old obese woman presents for evaluation of sleep disorder breathing. 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 Her boyfriend has noted loud snoring and witnessed apneas. Epworth sleepiness score is 13 and she report sleepiness and radius social situations. She denies any problems driving.  Bedtime is between 11 PM and 1 AM, sleep latency is prolonged up to an hour, she reports that it takes her a long time to relax, she starts of sleeping on her stomach but ends up on her side with one pillow, reports 2-3 nocturnal awakenings including nocturia and is out of bed by 5 AM on weekdays, on weekends she stays in bed until 9 AM-still feeling tired with occasional dryness of mouth. She is a mouth breather. She reports weight gain over the past 2 years but has started on a diet pill Qysmia ( phentermine with topiramate) and has lost 9 pounds.  There is no history suggestive of cataplexy, sleep paralysis or parasomnias  PSG 07/2014 showed total sleep time 180 minutes, supine less than 30 minutes, AHI of 12/hour with lowest desat of 88%  No past medical history on file.  No past surgical history on file.  No Known Allergies  Social History   Social History  . Marital Status: Single    Spouse Name: N/A  . Number of Children: N/A  . Years of Education: N/A   Occupational History  . Not on file.   Social History Main Topics  . Smoking status: Never Smoker   . Smokeless tobacco: Not on file  . Alcohol Use: Yes  . Drug Use: No  . Sexual Activity: Yes    Birth Control/ Protection: Implant   Other Topics Concern  . Not on file   Social History Narrative    Family History  Problem Relation Age of Onset  . Diabetes Other   . Heart disease Other   . Hyperlipidemia Other   . Hypertension Other   . Stroke Other     . Review of  Systems neg for any significant sore throat, dysphagia, itching, sneezing, nasal congestion or excess/ purulent secretions, fever, chills, sweats, unintended wt loss, pleuritic or exertional cp, hempoptysis, orthopnea pnd or change in chronic leg swelling. Also denies presyncope, palpitations, heartburn, abdominal pain, nausea, vomiting, diarrhea or change in bowel or urinary habits, dysuria,hematuria, rash, arthralgias, visual complaints, headache, numbness weakness or ataxia.     Objective:   Physical Exam  Gen. Pleasant, obese, in no distress, normal affect ENT - no lesions, no post nasal drip, class 2-3 airway Neck: No JVD, no thyromegaly, no carotid bruits Lungs: no use of accessory muscles, no dullness to percussion, decreased without rales or rhonchi  Cardiovascular: Rhythm regular, heart sounds  normal, no murmurs or gallops, no peripheral edema Abdomen: soft and non-tender, no hepatosplenomegaly, BS normal. Musculoskeletal: No deformities, no cyanosis or clubbing Neuro:  alert, non focal, no tremors       Assessment & Plan:

## 2014-10-22 NOTE — Assessment & Plan Note (Signed)
The pathophysiology of obstructive sleep apnea , it's cardiovascular consequences & modes of treatment including CPAP were discused with the patient in detail & they evidenced understanding. She has mild OSA but appears to be quite symptomatic. She is also overweight and I think CPAP would be her best choice. The long-term plan should definitely be for weight loss We will get her started with auto CPAP 5-12 and check download within 4 weeks, she may need a full face mask since she seems to be mouth breather. We will also provide her with humidity for comfort

## 2014-12-03 ENCOUNTER — Encounter: Payer: Self-pay | Admitting: Adult Health

## 2014-12-03 ENCOUNTER — Ambulatory Visit (INDEPENDENT_AMBULATORY_CARE_PROVIDER_SITE_OTHER): Payer: BLUE CROSS/BLUE SHIELD | Admitting: Adult Health

## 2014-12-03 VITALS — BP 124/82 | HR 86 | Temp 98.9°F | Ht 60.0 in | Wt 253.0 lb

## 2014-12-03 DIAGNOSIS — G4733 Obstructive sleep apnea (adult) (pediatric): Secondary | ICD-10-CM | POA: Diagnosis not present

## 2014-12-03 NOTE — Assessment & Plan Note (Signed)
Work on weight loss.

## 2014-12-03 NOTE — Patient Instructions (Signed)
Keep up the good work Wear C Pap at bedtime Always to wear for at least 4-6  hours each night Work On weight loss No driving if sleepy Follow-up with Dr. Vassie LollAlva in 6 months and as needed

## 2014-12-03 NOTE — Assessment & Plan Note (Signed)
Doing well on CPAP   Plan   Keep up the good work Wear C Pap at bedtime Always to wear for at least 4-6  hours each night Work On weight loss No driving if sleepy Follow-up with Dr. Vassie LollAlva in 6 months and as needed

## 2014-12-03 NOTE — Progress Notes (Signed)
   Subjective:    Patient ID: Thelma CompDashanna D Mcgath, female    DOB: 04/13/1985, 29 y.o.   MRN: [id number]  HPI 29 yo with mild OSA   12/03/2014 Follow up : Sleep apnea Patient returns for a 6 week follow-up. Patient underwent a sleep study that showed mild sleep apnea with an AHI 12/hr  She was started on C Pap at bedtime. Download October 8 through November 6 showed excellent compliance with average. Uses at 5 hours on AutoSet with an AHI of 0.5, mild leaks. She feels rested. Denies any mask issues.     Review of Systems Constitutional:   No  weight loss, night sweats,  Fevers, chills, fatigue, or  lassitude.  HEENT:   No headaches,  Difficulty swallowing,  Tooth/dental problems, or  Sore throat,                No sneezing, itching, ear ache, nasal congestion, post nasal drip,   CV:  No chest pain,  Orthopnea, PND, swelling in lower extremities, anasarca, dizziness, palpitations, syncope.   GI  No heartburn, indigestion, abdominal pain, nausea, vomiting, diarrhea, change in bowel habits, loss of appetite, bloody stools.   Resp: No shortness of breath with exertion or at rest.  No excess mucus, no productive cough,  No non-productive cough,  No coughing up of blood.  No change in color of mucus.  No wheezing.  No chest wall deformity  Skin: no rash or lesions.  GU: no dysuria, change in color of urine, no urgency or frequency.  No flank pain, no hematuria   MS:  No joint pain or swelling.  No decreased range of motion.  No back pain.  Psych:  No change in mood or affect. No depression or anxiety.  No memory loss.         Objective:   Physical Exam GEN: A/Ox3; pleasant , NAD, obese   HEENT:  [place]/AT,  EACs-clear, TMs-wnl, NOSE-clear, THROAT-clear, no lesions, no postnasal drip or exudate noted.   NECK:  Supple w/ fair ROM; no JVD; normal carotid impulses w/o bruits; no thyromegaly or nodules palpated; no lymphadenopathy.  RESP  Clear  P & A; w/o, wheezes/ rales/ or rhonchi.no  accessory muscle use, no dullness to percussion  CARD:  RRR, no m/r/g  , no peripheral edema, pulses intact, no cyanosis or clubbing.  GI:   Soft & nt; nml bowel sounds; no organomegaly or masses detected.  Musco: Warm bil, no deformities or joint swelling noted.   Neuro: alert, no focal deficits noted.    Skin: Warm, no lesions or rashes         Assessment & Plan:

## 2014-12-04 NOTE — Progress Notes (Signed)
Reviewed & agree with plan  

## 2014-12-25 ENCOUNTER — Encounter: Payer: Self-pay | Admitting: Adult Health

## 2015-06-11 ENCOUNTER — Ambulatory Visit (INDEPENDENT_AMBULATORY_CARE_PROVIDER_SITE_OTHER): Payer: BLUE CROSS/BLUE SHIELD | Admitting: Pulmonary Disease

## 2015-06-11 ENCOUNTER — Encounter: Payer: Self-pay | Admitting: Pulmonary Disease

## 2015-06-11 VITALS — BP 120/68 | HR 70 | Ht 65.0 in | Wt 251.6 lb

## 2015-06-11 DIAGNOSIS — G4733 Obstructive sleep apnea (adult) (pediatric): Secondary | ICD-10-CM | POA: Diagnosis not present

## 2015-06-11 NOTE — Assessment & Plan Note (Signed)
Weight loss of 25-30 pounds advised

## 2015-06-11 NOTE — Progress Notes (Signed)
   Subjective:    Patient ID: Jenna Blair, female    DOB: 12/24/1985, 30 y.o.   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   PSG 07/2014  mild sleep apnea with an AHI 12/hr   06/11/2015  Chief Complaint  Patient presents with  . Follow-up    doing well on cpap. no concerns.    She was started on C Pap at bedtime.She was initially very compliant Download October 8 through November 6 showed excellent compliance with average. Uses at 5 hours on AutoSet with an AHI of 0.5, mild leaks. She feels rested on nights where she uses a CPAP. She has a full face mask Denies any mask issues. Pressure is okay Download on CPAP 5-12 cm shows no residuals with sporadic usage and no leak  She is on a weight loss pill and is trying to lose weight  Review of Systems Patient denies significant dyspnea,cough, hemoptysis,  chest pain, palpitations, pedal edema, orthopnea, paroxysmal nocturnal dyspnea, lightheadedness, nausea, vomiting, abdominal or  leg pains      Objective:   Physical Exam  Gen. Pleasant, obese, in no distress ENT - no lesions, no post nasal drip Neck: No JVD, no thyromegaly, no carotid bruits Lungs: no use of accessory muscles, no dullness to percussion, decreased without rales or rhonchi  Cardiovascular: Rhythm regular, heart sounds  normal, no murmurs or gallops, no peripheral edema Musculoskeletal: No deformities, no cyanosis or clubbing , no tremors       Assessment & Plan:

## 2015-06-11 NOTE — Patient Instructions (Signed)
You are on auto CPAP settings CPAP works very well when used  Weight loss encouraged, compliance with goal of at least 6 hrs every night is the expectation. Advised against medications with sedative side effects Cautioned against driving when sleepy - understanding that sleepiness will vary on a day to day basis

## 2015-06-11 NOTE — Assessment & Plan Note (Signed)
on auto CPAP settings CPAP works very well when used  Weight loss encouraged, compliance with goal of at least 6 hrs every night is the expectation. Advised against medications with sedative side effects Cautioned against driving when sleepy - understanding that sleepiness will vary on a day to day basis

## 2015-06-18 ENCOUNTER — Encounter: Payer: Self-pay | Admitting: Pulmonary Disease

## 2015-07-07 LAB — CYTOLOGY - PAP: Pap: NEGATIVE

## 2015-12-11 ENCOUNTER — Encounter: Payer: Self-pay | Admitting: Adult Health

## 2015-12-12 ENCOUNTER — Encounter: Payer: Self-pay | Admitting: Adult Health

## 2015-12-12 ENCOUNTER — Ambulatory Visit (INDEPENDENT_AMBULATORY_CARE_PROVIDER_SITE_OTHER): Payer: BLUE CROSS/BLUE SHIELD | Admitting: Adult Health

## 2015-12-12 DIAGNOSIS — Z23 Encounter for immunization: Secondary | ICD-10-CM

## 2015-12-12 DIAGNOSIS — G4733 Obstructive sleep apnea (adult) (pediatric): Secondary | ICD-10-CM

## 2015-12-12 NOTE — Assessment & Plan Note (Signed)
Weight loss encouarged.

## 2015-12-12 NOTE — Assessment & Plan Note (Signed)
Good compliance and control on CPAP   Plan  Patient Instructions  Keep up the good work Wear C Pap at bedtime Always to wear for at least 4-6  hours each night Work On weight loss No driving if sleepy Follow-up with Dr. Vassie LollAlva in 1 year and As needed

## 2015-12-12 NOTE — Patient Instructions (Addendum)
Keep up the good work Wear C Pap at bedtime Always to wear for at least 4-6  hours each night Work On weight loss No driving if sleepy Follow-up with Dr. Vassie LollAlva in 1 year and As needed

## 2015-12-12 NOTE — Progress Notes (Signed)
   Subjective:    Patient ID: Jenna Blair, female    DOB: 06/17/1985, 30 y.o.   MRN: [id number]  HPI 30 yo with mild OSA   TEST  07/2014 sleep study that showed mild sleep apnea with an AHI 12/hr    12/12/2015 Follow up : Sleep apnea Patient returns for a 6 month follow-up. She says she is doing better on CPAP . Wearing it most every night for 6 hr .  Feels more rested with less daytime sleepiness.  Download October 18  through November 16 showed excellent compliance with average. Usage at 6 hr AutoSet with an AHI of 0.2, mild leaks. She feels rested. Denies any mask issues. Exercising working on wt loss.   PMH  OSA    Current Outpatient Prescriptions on File Prior to Visit  Medication Sig Dispense Refill  . Phentermine-Topiramate (QSYMIA) 7.5-46 MG CP24 Take 1 tablet by mouth daily.     Marland Kitchen. terbinafine (LAMISIL) 250 MG tablet Take 250 mg by mouth daily.  0   No current facility-administered medications on file prior to visit.        Review of Systems Constitutional:   No  weight loss, night sweats,  Fevers, chills, fatigue, or  lassitude.  HEENT:   No headaches,  Difficulty swallowing,  Tooth/dental problems, or  Sore throat,                No sneezing, itching, ear ache, nasal congestion, post nasal drip,   CV:  No chest pain,  Orthopnea, PND, swelling in lower extremities, anasarca, dizziness, palpitations, syncope.   GI  No heartburn, indigestion, abdominal pain, nausea, vomiting, diarrhea, change in bowel habits, loss of appetite, bloody stools.   Resp: No shortness of breath with exertion or at rest.  No excess mucus, no productive cough,  No non-productive cough,  No coughing up of blood.  No change in color of mucus.  No wheezing.  No chest wall deformity  Skin: no rash or lesions.  GU: no dysuria, change in color of urine, no urgency or frequency.  No flank pain, no hematuria   MS:  No joint pain or swelling.  No decreased range of motion.  No back  pain.  Psych:  No change in mood or affect. No depression or anxiety.  No memory loss.         Objective:   Physical Exam   Vitals:   12/12/15 1003  BP: 118/70  Pulse: 62  Temp: 97.7 F (36.5 C)  SpO2: 98%  Weight: 246 lb (111.6 kg)   Body mass index is 40.94 kg/m.   GEN: A/Ox3; pleasant , NAD, obese    HEENT:  [place]/AT,  EACs-clear, TMs-wnl, NOSE-clear, THROAT-clear, no lesions, no postnasal drip or exudate noted. Class 2-3 MP airway   NECK:  Supple w/ fair ROM; no JVD; normal carotid impulses w/o bruits; no thyromegaly or nodules palpated; no lymphadenopathy.    RESP  Clear  P & A; w/o, wheezes/ rales/ or rhonchi. no accessory muscle use, no dullness to percussion  CARD:  RRR, no m/r/g  , no peripheral edema, pulses intact, no cyanosis or clubbing.  GI:   Soft & nt; nml bowel sounds; no organomegaly or masses detected.   Musco: Warm bil, no deformities or joint swelling noted.   Neuro: alert, no focal deficits noted.    Skin: Warm, no lesions or rashes   Tammy Parrett NP-C  [facility]  12/12/15

## 2015-12-17 NOTE — Progress Notes (Signed)
Reviewed & agree with plan  

## 2015-12-22 ENCOUNTER — Ambulatory Visit: Payer: BLUE CROSS/BLUE SHIELD | Admitting: Obstetrics and Gynecology

## 2016-02-11 ENCOUNTER — Ambulatory Visit: Payer: BLUE CROSS/BLUE SHIELD | Admitting: Obstetrics

## 2016-02-24 ENCOUNTER — Encounter: Payer: Self-pay | Admitting: Obstetrics

## 2016-02-24 ENCOUNTER — Ambulatory Visit (INDEPENDENT_AMBULATORY_CARE_PROVIDER_SITE_OTHER): Payer: BLUE CROSS/BLUE SHIELD | Admitting: Obstetrics

## 2016-02-24 DIAGNOSIS — N898 Other specified noninflammatory disorders of vagina: Secondary | ICD-10-CM

## 2016-02-24 DIAGNOSIS — N939 Abnormal uterine and vaginal bleeding, unspecified: Secondary | ICD-10-CM | POA: Diagnosis not present

## 2016-02-24 DIAGNOSIS — Z3202 Encounter for pregnancy test, result negative: Secondary | ICD-10-CM | POA: Diagnosis not present

## 2016-02-24 DIAGNOSIS — B9689 Other specified bacterial agents as the cause of diseases classified elsewhere: Secondary | ICD-10-CM | POA: Diagnosis not present

## 2016-02-24 DIAGNOSIS — N760 Acute vaginitis: Secondary | ICD-10-CM

## 2016-02-24 DIAGNOSIS — Z113 Encounter for screening for infections with a predominantly sexual mode of transmission: Secondary | ICD-10-CM

## 2016-02-24 LAB — POCT URINE PREGNANCY: Preg Test, Ur: NEGATIVE

## 2016-02-24 MED ORDER — TINIDAZOLE 500 MG PO TABS: 1000.0000 mg | ORAL_TABLET | Freq: Every day | ORAL | 2 refills | Status: DC

## 2016-02-24 NOTE — Progress Notes (Signed)
Patient has had the Nexplanon and she has started  having AUB. She is concerned about possible exposure to STD, pregnancy and the efficacy of her birth control.

## 2016-02-24 NOTE — Progress Notes (Signed)
Subjective:    Jenna Blair is a 31 y.o. female who presents for BTB with Nexplanon. The patient has had Nexplanon since 2015, and had been amenorrheic until 10 / 2017. The patient is sexually active. Pertinent past medical history: none.  The information documented in the HPI was reviewed and verified.  Menstrual History: OB History    No data available       No LMP recorded. Patient has had an implant.   Patient Active Problem List   Diagnosis Date Noted  . Morbid obesity (HCC) 12/03/2014  . OSA (obstructive sleep apnea) 10/22/2014   History reviewed. No pertinent past medical history.  History reviewed. No pertinent surgical history.   Current Outpatient Prescriptions:  .  phentermine 15 MG capsule, Take 15 mg by mouth every morning., Disp: , Rfl:  .  terbinafine (LAMISIL) 250 MG tablet, Take 250 mg by mouth daily., Disp: , Rfl: 0 .  topiramate (TOPAMAX) 25 MG tablet, Take 25 mg by mouth 2 (two) times daily., Disp: , Rfl:  No Known Allergies  Social History  Substance Use Topics  . Smoking status: Never Smoker  . Smokeless tobacco: Never Used  . Alcohol use Yes    Family History  Problem Relation Age of Onset  . Diabetes Other   . Heart disease Other   . Hyperlipidemia Other   . Hypertension Other   . Stroke Other        Review of Systems Constitutional: negative for weight loss Genitourinary:negative for abnormal menstrual periods and vaginal discharge   Objective:   There were no vitals taken for this visit.   General:   alert  Skin:   no rash or abnormalities  Lungs:   clear to auscultation bilaterally  Heart:   regular rate and rhythm, S1, S2 normal, no murmur, click, rub or gallop  Breasts:   normal without suspicious masses, skin or nipple changes or axillary nodes  Abdomen:  normal findings: no organomegaly, soft, non-tender and no hernia  Pelvis:  External genitalia: normal general appearance Urinary system: urethral meatus normal and bladder  without fullness, nontender Vaginal: normal without tenderness, induration or masses Cervix: normal appearance Adnexa: normal bimanual exam Uterus: anteverted and non-tender, normal size   Lab Review Urine pregnancy test Labs reviewed yes Radiologic studies reviewed no  50% of 15 min visit spent on counseling and coordination of care.    Assessment:    31 y.o., continuing Nexplanon, no contraindications.   AUB BV  Plan:    Tindamax Rx  All questions answered. Chlamydia specimen. Contraception: Nexplanon. Diagnosis explained in detail, including differential. Follow up in 3 months. GC specimen. Wet prep.    Meds ordered this encounter  Medications  . phentermine 15 MG capsule    Sig: Take 15 mg by mouth every morning.  . topiramate (TOPAMAX) 25 MG tablet    Sig: Take 25 mg by mouth 2 (two) times daily.   Orders Placed This Encounter  Procedures  . POCT urine pregnancy    Patient ID: Jenna Blair, female   DOB: 11/06/1985, 31 y.o.   MRN: [id number]

## 2016-02-25 ENCOUNTER — Other Ambulatory Visit: Payer: Self-pay | Admitting: Obstetrics

## 2016-02-25 LAB — CERVICOVAGINAL ANCILLARY ONLY
Bacterial vaginitis: POSITIVE — AB
Candida vaginitis: NEGATIVE
Chlamydia: NEGATIVE
Neisseria Gonorrhea: NEGATIVE
Trichomonas: NEGATIVE

## 2016-02-25 LAB — HIV ANTIBODY (ROUTINE TESTING W REFLEX): HIV Screen 4th Generation wRfx: NONREACTIVE

## 2016-02-25 LAB — HEPATITIS B SURFACE ANTIGEN: Hepatitis B Surface Ag: NEGATIVE

## 2016-02-25 LAB — RPR: RPR Ser Ql: NONREACTIVE

## 2016-02-25 LAB — HEPATITIS C ANTIBODY: Hep C Virus Ab: <0.1 s/co ratio (ref 0.0–0.9)

## 2016-05-21 ENCOUNTER — Ambulatory Visit (INDEPENDENT_AMBULATORY_CARE_PROVIDER_SITE_OTHER): Payer: BLUE CROSS/BLUE SHIELD | Admitting: Obstetrics

## 2016-05-21 ENCOUNTER — Encounter: Payer: Self-pay | Admitting: *Deleted

## 2016-05-21 ENCOUNTER — Encounter: Payer: Self-pay | Admitting: Obstetrics

## 2016-05-21 VITALS — BP 114/79 | HR 92 | Ht 60.0 in | Wt 228.0 lb

## 2016-05-21 DIAGNOSIS — Z3049 Encounter for surveillance of other contraceptives: Secondary | ICD-10-CM

## 2016-05-21 DIAGNOSIS — Z3046 Encounter for surveillance of implantable subdermal contraceptive: Secondary | ICD-10-CM

## 2016-05-21 DIAGNOSIS — Z30019 Encounter for initial prescription of contraceptives, unspecified: Secondary | ICD-10-CM

## 2016-05-21 MED ORDER — ETONOGESTREL 68 MG ~~LOC~~ IMPL
68.0000 mg | DRUG_IMPLANT | Freq: Once | SUBCUTANEOUS | Status: AC
  Administered 2016-05-21: 68 mg via SUBCUTANEOUS

## 2016-05-21 NOTE — Progress Notes (Signed)
NEXPLANON REMOVAL NOTE  Date of LMP:   unknown  Contraception used: *Nexplanon   Indications:  The patient desires contraception.  She understands risks, benefits, and alternatives to Implanon and would like to proceed.  Anesthesia:   Lidocaine 1% plain.  Procedure:  A time-out was performed confirming the procedure and the patient's allergy status.  Complications: None                      The rod was palpated and the area was sterilely prepped.  The area beneath the distal tip was anesthetized with 1% xylocaine and the skin incised                       Over the tip and the tip was exposed, grasped with forcep and removed intact.  A single suture of 4-0 Vicryl was used to close incision.  Steri strip applied.                          Nexplanon Procedure Note   PRE-OP DIAGNOSIS: desired long-term, reversible contraception ( LARC ) POST-OP DIAGNOSIS: Same  PROCEDURE: Nexplanon  placement Performing Provider: Bing Neighbors. Clearance Coots MD  Patient education prior to procedure, explained risk, benefits of Nexplanon, reviewed alternative options. Patient reported understanding. Gave consent to continue with procedure.   PROCEDURE:  Pregnancy Text :  Negative Site (check):      left arm         Sterile Preparation:   Betadinex3 Lot # [id number] Expiration Date 05 / 2020  Insertion site was selected 8 - 10 cm from medial epicondyle and marked along with guiding site using sterile marker. Procedure area was prepped and draped in a sterile fashion. 1% Lidocaine 1.5 ml given prior to procedure. Nexplanon  was inserted subcutaneously.Needle was removed from the insertion site. Nexplanon capsule was palpated by provider and patient to assure satisfactory placement. Dressing applied.  Followup: The patient tolerated the procedure well without complications.  Standard post-procedure care is explained and return precautions are given.  Marvel Sapp A. Clearance Coots MD

## 2016-05-21 NOTE — Progress Notes (Signed)
Patient is in the office for nexplanon removal and re-insertion 

## 2016-06-04 ENCOUNTER — Encounter: Payer: Self-pay | Admitting: Obstetrics

## 2016-06-04 ENCOUNTER — Ambulatory Visit (INDEPENDENT_AMBULATORY_CARE_PROVIDER_SITE_OTHER): Payer: BLUE CROSS/BLUE SHIELD | Admitting: Obstetrics

## 2016-06-04 VITALS — BP 128/82 | HR 94 | Wt 220.0 lb

## 2016-06-04 DIAGNOSIS — Z3046 Encounter for surveillance of implantable subdermal contraceptive: Secondary | ICD-10-CM

## 2016-06-04 DIAGNOSIS — Z975 Presence of (intrauterine) contraceptive device: Secondary | ICD-10-CM

## 2016-06-04 NOTE — Progress Notes (Signed)
Subjective:    Jenna Blair is a 31 y.o. female who presents for contraception counseling. The patient has no complaints today. The patient is sexually active. Pertinent past medical history: none.  The information documented in the HPI was reviewed and verified.  Menstrual History: OB History    No data available      Patient's last menstrual period was 03/30/2016 (approximate).   Patient Active Problem List   Diagnosis Date Noted  . Morbid obesity (HCC) 12/03/2014  . OSA (obstructive sleep apnea) 10/22/2014   History reviewed. No pertinent past medical history.  History reviewed. No pertinent surgical history.   Current Outpatient Prescriptions:  .  phentermine 15 MG capsule, Take 15 mg by mouth every morning., Disp: , Rfl:  .  terbinafine (LAMISIL) 250 MG tablet, Take 250 mg by mouth daily., Disp: , Rfl: 0 .  tinidazole (TINDAMAX) 500 MG tablet, Take 2 tablets (1,000 mg total) by mouth daily with breakfast. (Patient not taking: Reported on 06/04/2016), Disp: 10 tablet, Rfl: 2 .  topiramate (TOPAMAX) 25 MG tablet, Take 25 mg by mouth 2 (two) times daily., Disp: , Rfl:  No Known Allergies  Social History  Substance Use Topics  . Smoking status: Never Smoker  . Smokeless tobacco: Never Used  . Alcohol use Yes    Family History  Problem Relation Age of Onset  . Diabetes Other   . Heart disease Other   . Hyperlipidemia Other   . Hypertension Other   . Stroke Other        Review of Systems Constitutional: negative for weight loss Genitourinary:negative for abnormal menstrual periods and vaginal discharge   Objective:   BP 128/82   Pulse 94   Wt 220 lb (99.8 kg)   LMP 03/30/2016 (Approximate)   BMI 42.97 kg/m           General:  Alert and no distress          LUE:  Nexplanon insertion site is clean.  Rod palpated, intact, non tender  Lab Review Urine pregnancy test Labs reviewed yes Radiologic studies reviewed no  50% of 15 min visit spent on counseling  and coordination of care.    Assessment:    31 y.o., continuing Nexplanon, no contraindications.   Plan:    All questions answered. Discussed healthy lifestyle modifications. Follow up as needed.  No orders of the defined types were placed in this encounter.  No orders of the defined types were placed in this encounter.

## 2016-06-04 NOTE — Progress Notes (Signed)
Patient presents for Nexplanon follow-up

## 2016-07-07 ENCOUNTER — Encounter: Payer: Self-pay | Admitting: Obstetrics

## 2016-07-07 ENCOUNTER — Ambulatory Visit (INDEPENDENT_AMBULATORY_CARE_PROVIDER_SITE_OTHER): Payer: BLUE CROSS/BLUE SHIELD | Admitting: Obstetrics

## 2016-07-07 VITALS — BP 124/89 | HR 84 | Wt 214.7 lb

## 2016-07-07 DIAGNOSIS — Z124 Encounter for screening for malignant neoplasm of cervix: Secondary | ICD-10-CM | POA: Diagnosis not present

## 2016-07-07 DIAGNOSIS — L298 Other pruritus: Secondary | ICD-10-CM | POA: Diagnosis not present

## 2016-07-07 DIAGNOSIS — N898 Other specified noninflammatory disorders of vagina: Secondary | ICD-10-CM

## 2016-07-07 DIAGNOSIS — Z01419 Encounter for gynecological examination (general) (routine) without abnormal findings: Secondary | ICD-10-CM

## 2016-07-07 DIAGNOSIS — Z113 Encounter for screening for infections with a predominantly sexual mode of transmission: Secondary | ICD-10-CM

## 2016-07-07 NOTE — Progress Notes (Signed)
Patient is in the office today for her annual exam. She is also requesting STD check.

## 2016-07-07 NOTE — Progress Notes (Signed)
Subjective:        Jenna Blair is a 31 y.o. female here for a routine exam.  Current complaints: None.    Personal health questionnaire:  Is patient [name], have a family history of breast and/or ovarian cancer: no Is there a family history of uterine cancer diagnosed at age < 64, gastrointestinal cancer, urinary tract cancer, family member who is a Personnel officer syndrome-associated carrier: no Is the patient overweight and hypertensive, family history of diabetes, personal history of gestational diabetes, preeclampsia or PCOS: no Is patient over 61, have PCOS,  family history of premature CHD under age 63, diabetes, smoke, have hypertension or peripheral artery disease:  no At any time, has a partner hit, kicked or otherwise hurt or frightened you?: no Over the past 2 weeks, have you felt down, depressed or hopeless?: no Over the past 2 weeks, have you felt little interest or pleasure in doing things?:no   Gynecologic History Patient's last menstrual period was 06/28/2016. Contraception: Nexplanon Last Pap: 2017. Results were: normal Last mammogram: n/a. Results were: n/a  Obstetric History OB History  No data available    History reviewed. No pertinent past medical history.  History reviewed. No pertinent surgical history.   Current Outpatient Prescriptions:  .  phentermine 15 MG capsule, Take 15 mg by mouth every morning., Disp: , Rfl:  .  terbinafine (LAMISIL) 250 MG tablet, Take 250 mg by mouth daily., Disp: , Rfl: 0 No Known Allergies  Social History  Substance Use Topics  . Smoking status: Never Smoker  . Smokeless tobacco: Never Used  . Alcohol use Yes     Comment: occasional    Family History  Problem Relation Age of Onset  . Diabetes Other   . Heart disease Other   . Hyperlipidemia Other   . Hypertension Other   . Stroke Other   . Thyroid disease Other       Review of Systems  Constitutional: negative for fatigue and weight  loss Respiratory: negative for cough and wheezing Cardiovascular: negative for chest pain, fatigue and palpitations Gastrointestinal: negative for abdominal pain and change in bowel habits Musculoskeletal:negative for myalgias Neurological: negative for gait problems and tremors Behavioral/Psych: negative for abusive relationship, depression Endocrine: negative for temperature intolerance    Genitourinary:negative for abnormal menstrual periods, genital lesions, hot flashes, sexual problems and vaginal discharge Integument/breast: negative for breast lump, breast tenderness, nipple discharge and skin lesion(s)    Objective:       BP 124/89   Pulse 84   Wt 214 lb 11.2 oz (97.4 kg)   LMP 06/28/2016 Comment: spotting-intermittant- dark blood  BMI 41.93 kg/m  General:   alert  Skin:   no rash or abnormalities  Lungs:   clear to auscultation bilaterally  Heart:   regular rate and rhythm, S1, S2 normal, no murmur, click, rub or gallop  Breasts:   normal without suspicious masses, skin or nipple changes or axillary nodes  Abdomen:  normal findings: no organomegaly, soft, non-tender and no hernia  Pelvis:  External genitalia: normal general appearance Urinary system: urethral meatus normal and bladder without fullness, nontender Vaginal: normal without tenderness, induration or masses Cervix: normal appearance Adnexa: normal bimanual exam Uterus: anteverted and non-tender, normal size   Lab Review Urine pregnancy test Labs reviewed yes Radiologic studies reviewed no  50% of 20 min visit spent on counseling and coordination of care.    Assessment:    Healthy female exam.    Plan:  Education reviewed: calcium supplements, depression evaluation, low fat, low cholesterol diet, safe sex/STD prevention, self breast exams and weight bearing exercise. Contraception: Nexplanon. Follow up in: 1 year.   No orders of the defined types were placed in this encounter.  Orders Placed  This Encounter  Procedures  . STD Panel     Patient ID: Jenna Blair, female   DOB: 09/08/1985, 31 y.o.   MRN: [id number]

## 2016-07-09 LAB — CYTOLOGY - PAP: Diagnosis: NEGATIVE

## 2016-07-09 LAB — CERVICOVAGINAL ANCILLARY ONLY
Bacterial vaginitis: NEGATIVE
Candida vaginitis: POSITIVE — AB
Chlamydia: NEGATIVE
Neisseria Gonorrhea: NEGATIVE
Trichomonas: NEGATIVE

## 2016-07-09 LAB — RPR+HSVIGM+HBSAG+HSV2(IGG)+...
HIV Screen 4th Generation wRfx: NONREACTIVE
HSV 2 Glycoprotein G Ab, IgG: 19.6 index — ABNORMAL HIGH (ref 0.00–0.90)
HSVI/II Comb IgM: <0.91 Ratio (ref 0.00–0.90)
Hepatitis B Surface Ag: NEGATIVE
RPR Ser Ql: NONREACTIVE

## 2016-07-10 ENCOUNTER — Other Ambulatory Visit: Payer: Self-pay | Admitting: Obstetrics

## 2016-07-10 DIAGNOSIS — B3731 Acute candidiasis of vulva and vagina: Secondary | ICD-10-CM

## 2016-07-10 DIAGNOSIS — B373 Candidiasis of vulva and vagina: Secondary | ICD-10-CM

## 2016-07-10 MED ORDER — FLUCONAZOLE 150 MG PO TABS
150.0000 mg | ORAL_TABLET | Freq: Once | ORAL | 0 refills | Status: AC
Start: 2016-07-10 — End: 2016-07-10

## 2016-11-23 ENCOUNTER — Encounter (HOSPITAL_COMMUNITY): Payer: Self-pay | Admitting: Emergency Medicine

## 2016-11-23 ENCOUNTER — Ambulatory Visit (HOSPITAL_COMMUNITY)
Admission: EM | Admit: 2016-11-23 | Discharge: 2016-11-23 | Disposition: A | Discharge status: Home / Self Care | Payer: BLUE CROSS/BLUE SHIELD | Attending: Family Medicine | Admitting: Family Medicine

## 2016-11-23 DIAGNOSIS — L508 Other urticaria: Secondary | ICD-10-CM

## 2016-11-23 MED ORDER — HYDROXYZINE HCL 10 MG PO TABS: 10.0000 mg | ORAL_TABLET | Freq: Three times a day (TID) | ORAL | 0 refills | Status: DC | PRN

## 2016-11-23 MED ORDER — CETIRIZINE HCL 10 MG PO TABS: 10.0000 mg | ORAL_TABLET | Freq: Every day | ORAL | 1 refills | Status: DC

## 2016-11-23 NOTE — ED Triage Notes (Signed)
Pt has multiple complaints. c/o breaking out in hives all over body. Took benadryl without relief. Pt started having chest pain today. Pt denies chest pain at this time. Pt also endorses having Side pain x1 month, states shes noticed off and on blood in stool.

## 2016-11-23 NOTE — Discharge Instructions (Signed)
Take zyrtec every day and hydroxyzine up to 3 times a day as needed. Things to watch out for are difficulty breathing, throat closing up and if does not improve after several weeks (~6weeks). Please get back in touch or find a new primary care about the belly pain with blood in your stool.

## 2016-11-23 NOTE — ED Provider Notes (Signed)
[facility]    CSN: [id number] Arrival date & time: 11/23/16  0957     History   Chief Complaint Chief Complaint  Patient presents with  . Rash  . Chest Pain  . Abdominal Pain    HPI Jenna Blair is a 31 y.o. female.   Patient is a 31 yo F who presents with a rash. 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 Started having itching diffusely over her body on Sunday night while she was trying to sleep, woke up the next morning with a rash all over her body sparing her face. No difficulty breathing or lip/throat swelling. She took 1 pill of benadryl yesterday around noon without much relief so has not taken any other over the counter medications. She did eat at her friends house on Sunday during the day but no new foods. Denies denies new medications or detergents or perfumes or chemicals or environmental exposures. Also states that unrelated to this acute complaints has been having some blood in stool with wiping for over 1 month associated with intermittent R groin pain over the same time period, has PCP but has not seen in awhile. No abdominal pain today.      History reviewed. No pertinent past medical history.  Patient Active Problem List   Diagnosis Date Noted  . Morbid obesity (HCC) 12/03/2014  . OSA (obstructive sleep apnea) 10/22/2014    History reviewed. No pertinent surgical history.  OB History    No data available       Home Medications    Prior to Admission medications   Medication Sig Start Date End Date Taking? Authorizing Provider  phentermine 15 MG capsule Take 15 mg by mouth every morning.    [provider]  terbinafine (LAMISIL) 250 MG tablet Take 250 mg by mouth daily. 05/20/15   [provider]    Family History Family History  Problem Relation Age of Onset  . Diabetes  Other   . Heart disease Other   . Hyperlipidemia Other   . Hypertension Other   . Stroke Other   . Thyroid disease Other     Social History Social History  Substance Use Topics  . Smoking status: Never Smoker  . Smokeless tobacco: Never Used  . Alcohol use Yes     Comment: occasional     Allergies   Patient has no known allergies.   Review of Systems Review of Systems  Constitutional: Negative for chills and fever.  HENT: Negative for congestion, rhinorrhea, sore throat and trouble swallowing.   Respiratory: Negative for chest tightness, shortness of breath and wheezing.   Cardiovascular: Negative for chest pain (states had chest pain walking into urgent care today briefly because she was anxious but resolved quickly and no chest pain currently), palpitations and leg swelling.  Gastrointestinal: Positive for blood in stool. Negative for abdominal pain, constipation, diarrhea, nausea and vomiting.  Skin: Positive for rash. Negative for wound.  Neurological: Negative for speech difficulty, weakness, numbness and headaches.     Physical Exam Triage Vital Signs ED Triage Vitals  Enc Vitals Group     BP 11/23/16 1012 137/76     Pulse Rate 11/23/16 1012 69     Resp 11/23/16 1012 16     Temp 11/23/16 1012 98.4 F (36.9 C)  Temp Source 11/23/16 1012 Oral     SpO2 11/23/16 1012 99 %     Weight --      Height --      Head Circumference --      Peak Flow --      Pain Score 11/23/16 1015 5     Pain Loc --      Pain Edu? --      Excl. in GC? --    No data found.   Updated Vital Signs BP 137/76   Pulse 69   Temp 98.4 F (36.9 C) (Oral)   Resp 16   SpO2 99%   Visual Acuity Right Eye Distance:   Left Eye Distance:   Bilateral Distance:    Right Eye Near:   Left Eye Near:    Bilateral Near:     Physical Exam  Constitutional: She is oriented to person, place, and time. She appears well-developed and well-nourished. No distress.  HENT:  Head: Normocephalic  and atraumatic.  Nose: Nose normal.  Mouth/Throat: Oropharynx is clear and moist. No oropharyngeal exudate.  Eyes: Conjunctivae and EOM are normal. Right eye exhibits no discharge. Left eye exhibits no discharge.  Neck: Normal range of motion. Neck supple.  Cardiovascular: Normal rate, regular rhythm and normal heart sounds.   No murmur heard. Pulmonary/Chest: Effort normal and breath sounds normal. She has no wheezes.  Abdominal: Soft. Bowel sounds are normal. She exhibits no distension. There is no tenderness. There is no rebound and no guarding.  Musculoskeletal: Normal range of motion. She exhibits no edema.  Lymphadenopathy:    She has no cervical adenopathy.  Neurological: She is alert and oriented to person, place, and time. She exhibits normal muscle tone.  Skin: Skin is warm and dry. Capillary refill takes less than 2 seconds. Rash (wheal and flare erythematous rash diffusely over torso and extremities sparing face) noted. She is not diaphoretic.  Psychiatric: She has a normal mood and affect.     UC Treatments / Results  Labs (all labs ordered are listed, but only abnormal results are displayed) Labs Reviewed - No data to display  EKG  EKG Interpretation None       Radiology No results found.  Procedures Procedures (including critical care time)  Medications Ordered in UC Medications - No data to display   Initial Impression / Assessment and Plan / UC Course  I have reviewed the triage vital signs and the nursing notes.  Pertinent labs & imaging results that were available during my care of the patient were reviewed by me and considered in my medical decision making (see chart for details).   Patient is a 31 yo F who presented to urgent care with a pruritic rash that has a wheal and flare appearance consistent with urticaria. She has had no lip/throat swelling or difficulty breathing. No new exposures to foods or medications. Most likely from the viral head cold  she had at the beginning of last week. Advised symptomatic treatment with antihistamine cetirizine and hydroxyzine for itching. Discussed return precautions including but not limited to respiratory compromise, angioedema and failure to resolve over the next 6 weeks. Regarding patient's long standing intermittent abdominal pain and blood in stool, abdominal exam is benign today and since patient is established with a PCP advised that she follow up with her PCP for evaluation for this. She voiced good understanding.    Final Clinical Impressions(s) / UC Diagnoses   Final diagnoses:  Urticaria, acute  New Prescriptions New Prescriptions   No medications on file      Leland Her, DO 11/23/16 1049

## 2016-12-30 ENCOUNTER — Encounter: Payer: Self-pay | Admitting: Gastroenterology

## 2017-01-07 ENCOUNTER — Ambulatory Visit: Payer: BLUE CROSS/BLUE SHIELD | Admitting: Gastroenterology

## 2017-01-07 ENCOUNTER — Encounter: Payer: Self-pay | Admitting: Gastroenterology

## 2017-01-07 ENCOUNTER — Telehealth: Payer: Self-pay | Admitting: Gastroenterology

## 2017-01-07 VITALS — BP 118/72 | HR 68 | Ht 60.0 in | Wt 234.0 lb

## 2017-01-07 DIAGNOSIS — K625 Hemorrhage of anus and rectum: Secondary | ICD-10-CM | POA: Diagnosis not present

## 2017-01-07 DIAGNOSIS — K648 Other hemorrhoids: Secondary | ICD-10-CM

## 2017-01-07 MED ORDER — HYDROCORTISONE ACETATE 25 MG RE SUPP: RECTAL | 0 refills | Status: DC

## 2017-01-07 NOTE — Progress Notes (Signed)
Assessment and plans reviewed  

## 2017-01-07 NOTE — Patient Instructions (Signed)
We have sent the following medications to your pharmacy [facility] you to [facility] [place].  Call us back in a few weeks and give an update- Ask for Patty- Jessica's nurse.  Call [phone] choose option 2.

## 2017-01-07 NOTE — Progress Notes (Signed)
01/07/2017 Jenna Blair [id number] 11/15/1985   HISTORY OF PRESENT ILLNESS: This is a pleasant 31 year old female who is new to our practice.  She was referred here by Cari Carawayonna Odem, F-NP for evaluation of rectal bleeding.  She tells me that recently she has been seeing blood with bowel movements.  She says that occasionally she has some diarrhea, but never really struggles with constipation.  The blood is described as bright red in color with very small clots.  She says that it is mostly on the tissue when she wipes, but has seen a small streak on her stool in the toilet bowl before.  She does not have any bleeding between bowel movements.  She also reports a right lower quadrant abdominal pain that seems to come and go.  Seems unrelated to eating and bowel movements.  Says it just seems to occur randomly.  Saw gynecology and had a transvaginal ultrasound performed that was normal.   Past Medical History:  Diagnosis Date  . Sleep apnea    Uses C Pap nightly   Past Surgical History:  Procedure Laterality Date  . CESAREAN SECTION     x1  . LEG SURGERY Right    Injury as a child  . TOOTH EXTRACTION      reports that  has never smoked. she has never used smokeless tobacco. She reports that she drinks alcohol. She reports that she does not use drugs. family history includes Diabetes in her maternal aunt, other, and paternal aunt; Heart disease in her other; Hyperlipidemia in her other; Hypertension in her other; Stroke in her other; Thyroid disease in her other. No Known Allergies    Outpatient Encounter Medications as of 01/07/2017  Medication Sig  . amoxicillin (AMOXIL) 500 MG tablet Take 500 mg by mouth 2 (two) times daily.  Marland Kitchen. ibuprofen (ADVIL,MOTRIN) 800 MG tablet Take 800 mg by mouth every 8 (eight) hours as needed.  . cetirizine (ZYRTEC) 10 MG tablet Take 1 tablet (10 mg total) by mouth daily. (Patient not taking: Reported on 01/07/2017)  . hydrOXYzine (ATARAX/VISTARIL) 10 MG  tablet Take 1 tablet (10 mg total) by mouth 3 (three) times daily as needed. (Patient not taking: Reported on 01/07/2017)  . phentermine 15 MG capsule Take 15 mg by mouth every morning.  . terbinafine (LAMISIL) 250 MG tablet Take 250 mg by mouth daily.   No facility-administered encounter medications on file as of 01/07/2017.      REVIEW OF SYSTEMS  : All other systems reviewed and negative except where noted in the History of Present Illness.   PHYSICAL EXAM: BP 118/72   Pulse 68   Ht 5' (1.524 m)   Wt 234 lb (106.1 kg)   BMI 45.70 kg/m  General: Well developed black female in no acute distress Head: Normocephalic and atraumatic Eyes:  Sclerae anicteric, conjunctiva pink. Ears: Normal auditory acuity Lungs: Clear throughout to auscultation; no increased WOB. Heart: Regular rate and rhythm; no M/R/G. Abdomen: Soft, non-distended.  BS present.  Non-tender. Rectal:  No external abnormalities seen.  DRE did not reveal any masses.  There was light brown stool on exam glove and quite a bit of soft stool in the rectal vault.  Anoscopy revealed internal hemorrhoids with an actively bleeding posterior hemorrhoid.   Musculoskeletal: Symmetrical with no gross deformities  Skin: No lesions on visible extremities Extremities: No edema  Neurological: Alert oriented x 4, grossly non-focal Psychological:  Alert and cooperative. Normal mood and affect  ASSESSMENT  AND PLAN: *Rectal bleeding secondary to internal hemorrhoid.  We will treat this with hydrocortisone suppositories at bedtime for 7-10 days.  Can repeat this in the future if needed, but if it becomes a frequent and ongoing issue then potentially could consider banding. *Right lower quadrant abdominal pain intermittent, no pain currently.  Transvaginal ultrasound showed that everything was normal from a gynecologic standpoint.  Could still potentially be ovarian cysts that are occurring versus intestinal spasm.  Will observe for  now.  **I have asked her to call back in 2 or 3 weeks with an update on her bleeding.   CC:  Cari CarawayDonna Odem, F-NP

## 2017-01-07 NOTE — Telephone Encounter (Signed)
I advised the patient that if she cannot afford the Anusol HC Suppositories. It is fine for her to get Preperation H suppositories with Hydrocortisone. or the [facility] Brand Equate. Or any over the counter brand should be less expensive.

## 2017-04-11 NOTE — H&P (Signed)
Otolaryngology Clinic Note  HPI:    Jenna Blair is a 32 y.o. female patient of Helane Gunther, MD for evaluation of tinnitus, ear pain, muffled hearing, sleep apnea.  She works as a Surveyor, mining and a Lawyer.  She has had ringing in her ears for a long time, gradually worse.  She can detect her heartbeat in her ears.  wHearing seems more or less normal.  Her primary physician saw fluid behind both eardrums and gave her Flonase which she has now taken for 2 weeks without obvious improvement.  No recent upper respiratory infection or ear infection.  She does have sore throats,?  Tonsillitis 3 or 4 times per year.  In her youth, these were occasionally strep positive.  She does not smoke.  18 months ago, she had a sleep study for presumed sleep apnea.  She had an apnea hypoxia index of 12/h with desaturation down to 88%.  She has been using CPAP since then including a humidifier.  She does feel like the CPAP has improved her rest quality. PMH/Meds/All/SocHx/FamHx/ROS:   Past Medical History  History reviewed. No pertinent past medical history.    Past Surgical History       Past Surgical History:  Procedure Laterality Date  . CESAREAN SECTION    . WISDOM TOOTH EXTRACTION        No family history of bleeding disorders, wound healing problems or difficulty with anesthesia.   Social History  Social History        Social History  . Marital status: N/A    Spouse name: N/A  . Number of children: N/A  . Years of education: N/A      Occupational History  . Not on file.       Social History Main Topics  . Smoking status: Never Smoker  . Smokeless tobacco: Never Used  . Alcohol use Yes  . Drug use: Unknown  . Sexual activity: Not on file       Other Topics Concern  . Not on file      Social History Narrative  . No narrative on file       Current Outpatient Prescriptions:  .  HYDROcodone-acetaminophen 7.5-325 mg/15 mL  solution, Take 10-15 mLs by mouth every 4 (four) hours as needed for up to 10 days for Pain., Disp: 240 mL, Rfl: 0 .  ipratropium (ATROVENT) 0.03 % nasal spray, , Disp: , Rfl:   A complete ROS was performed with pertinent positives/negatives noted in the HPI. The remainder of the ROS are negative.    Physical Exam:    BP 101/78 (Site: Left arm)   Pulse 78   Ht 1.524 m (5')   Wt 110.2 kg (243 lb)   BMI 47.46 kg/m  She is short and quite heavyset.  Mental status is sharp.  She hears adequately in conversational speech.  Voice is clear and respirations unlabored through the nose.  The head is atraumatic and neck supple.  Cranial nerves intact.  Ear canals are clear.  There is a fluid meniscus behind each drum which she was unable to auto inflate.  Anterior nose shows a straight septum with congested turbinates.  They do improve significantly with Afrin decongestion.  Oral cavity is clear with teeth in good repair.  Oropharynx shows 3+ tonsils with a slightly long uvula.  Neck is fleshy and muscular with no adenopathy.    Pure tone audiometry is basically normal on each side with 100% discrimination each  side.    Tympanograms are type C on each side.   Impression & Plans:   Chronic eustachian tube dysfunction.  Adenotonsillar hypertrophy including moderate sleep apnea.  Inferior nasal turbinate hypertrophy.  Plan: I think removing her adenoids will help with her chronic eustachian tube difficulties.  I would like to do tonsillectomy, adenoidectomy, and reduce her nasal turbinates.  I discussed the surgery in detail including risks and complications.  Questions were answered and informed consent was obtained.  Send in a prescription for hydrocodone liquid for postoperative pain control.  I will see her 1 week postop.  No strenuous activities for 2 weeks.  I think there is a fair chance that we will be able to get rid of the CPAP machine for control of her sleep apnea.  Weight loss,  of course, remains on her agenda.  Fernande BoydenKarol Thaddeus Clarisse Rodriges, MD  04/08/2017

## 2017-04-13 NOTE — Pre-Procedure Instructions (Signed)
Jenna Blair  04/13/2017      [facility] [address]- [place], [place] - [id number] [place] [id number] Phone: [phone] Fax: [phone]    Your procedure is scheduled on April 20, 2017 .  [facility] at 530 AM.  Call this number if you have problems the morning of surgery:  [phone]   Remember:  Do not eat food or drink liquids after midnight.  Take these medicines the morning of surgery with A SIP OF WATER ipratropium (atrovent).   Do not wear jewelry, make-up or nail polish.  Do not wear lotions, powders, or perfumes, or deodorant.  Do not shave 48 hours prior to surgery.   Do not bring valuables to the hospital.  [facility] is not responsible for any belongings or valuables.  Contacts, dentures or bridgework may not be worn into surgery.  Leave your suitcase in the car.  After surgery it may be brought to your room.  For patients admitted to the hospital, discharge time will be determined by your treatment team.  Patients discharged the day of surgery will not be allowed to drive home.   [facility]- Preparing For Surgery  Before surgery, you can play an important role. Because skin is not sterile, your skin needs to be as free of germs as possible. You can reduce the number of germs on your skin by washing with CHG (chlorahexidine gluconate) Soap before surgery.  CHG is an antiseptic cleaner which kills germs and bonds with the skin to continue killing germs even after washing.  Please do not use if you have an allergy to CHG or antibacterial soaps. If your skin becomes reddened/irritated stop using the CHG.  Do not shave (including legs and underarms) for at least 48 hours prior to first CHG shower. It is OK to shave your face.  Please follow these instructions carefully.   1. Shower the NIGHT BEFORE SURGERY and the MORNING OF SURGERY with CHG.   2. If you chose to wash your hair,  wash your hair first as usual with your normal shampoo.  3. After you shampoo, rinse your hair and body thoroughly to remove the shampoo.  4. Use CHG as you would any other liquid soap. You can apply CHG directly to the skin and wash gently with a scrungie or a clean washcloth.   5. Apply the CHG Soap to your body ONLY FROM THE NECK DOWN.  Do not use on open wounds or open sores. Avoid contact with your eyes, ears, mouth and genitals (private parts). Wash Face and genitals (private parts)  with your normal soap.  6. Wash thoroughly, paying special attention to the area where your surgery will be performed.  7. Thoroughly rinse your body with warm water from the neck down.  8. DO NOT shower/wash with your normal soap after using and rinsing off the CHG Soap.  9. Pat yourself dry with a CLEAN TOWEL.  10. Wear CLEAN PAJAMAS to bed the night before surgery, wear comfortable clothes the morning of surgery  11. Place CLEAN SHEETS on your bed the night of your first shower and DO NOT SLEEP WITH PETS.   Day of Surgery: Do not apply any deodorants/lotions. Please wear clean clothes to the hospital/surgery center.     Please read over the following fact sheets that you were given. Pain Booklet, Coughing and Deep Breathing and Surgical Site Infection Prevention

## 2017-04-14 ENCOUNTER — Encounter (HOSPITAL_COMMUNITY)
Admission: RE | Admit: 2017-04-14 | Discharge: 2017-04-14 | Disposition: A | Discharge status: Home / Self Care | Payer: Managed Care, Other (non HMO) | Source: Ambulatory Visit | Attending: Otolaryngology | Admitting: Otolaryngology

## 2017-04-14 ENCOUNTER — Encounter (HOSPITAL_COMMUNITY): Payer: Self-pay

## 2017-04-14 ENCOUNTER — Other Ambulatory Visit: Payer: Self-pay

## 2017-04-14 DIAGNOSIS — G4730 Sleep apnea, unspecified: Secondary | ICD-10-CM | POA: Insufficient documentation

## 2017-04-14 DIAGNOSIS — J353 Hypertrophy of tonsils with hypertrophy of adenoids: Secondary | ICD-10-CM | POA: Diagnosis not present

## 2017-04-14 DIAGNOSIS — Z01812 Encounter for preprocedural laboratory examination: Secondary | ICD-10-CM | POA: Insufficient documentation

## 2017-04-14 HISTORY — DX: Depression, unspecified: F32.A

## 2017-04-14 HISTORY — DX: Other allergy status, other than to drugs and biological substances: Z91.09

## 2017-04-14 HISTORY — DX: Major depressive disorder, single episode, unspecified: F32.9

## 2017-04-14 LAB — CBC
HCT: 36.8 % (ref 36.0–46.0)
Hemoglobin: 12.2 g/dL (ref 12.0–15.0)
MCH: 29.3 pg (ref 26.0–34.0)
MCHC: 33.2 g/dL (ref 30.0–36.0)
MCV: 88.5 fL (ref 78.0–100.0)
Platelets: 295 10*3/uL (ref 150–400)
RBC: 4.16 MIL/uL (ref 3.87–5.11)
RDW: 13.6 % (ref 11.5–15.5)
WBC: 7.3 10*3/uL (ref 4.0–10.5)

## 2017-04-14 NOTE — Progress Notes (Signed)
PC[facility]: [facility]  Cardiologist: pt denies  EKG: pt denies past year  Stress test: pt denies ever  ECHO: pt denies ever  Cardiac Cath: pt denies ever  Chest x-ray: pt denies, no recent respiratory infections/complications

## 2017-04-20 ENCOUNTER — Ambulatory Visit (HOSPITAL_COMMUNITY): Payer: Managed Care, Other (non HMO) | Admitting: Certified Registered"

## 2017-04-20 ENCOUNTER — Other Ambulatory Visit: Payer: Self-pay

## 2017-04-20 ENCOUNTER — Encounter (HOSPITAL_COMMUNITY): Payer: Self-pay | Admitting: *Deleted

## 2017-04-20 ENCOUNTER — Encounter (HOSPITAL_COMMUNITY)
Admission: RE | Disposition: A | Discharge status: Home / Self Care | Payer: Self-pay | Source: Ambulatory Visit | Attending: Otolaryngology

## 2017-04-20 ENCOUNTER — Observation Stay (HOSPITAL_COMMUNITY)
Admission: RE | Admit: 2017-04-20 | Discharge: 2017-04-21 | Disposition: A | Discharge status: Home / Self Care | Payer: Managed Care, Other (non HMO) | Source: Ambulatory Visit | Attending: Otolaryngology | Admitting: Otolaryngology

## 2017-04-20 DIAGNOSIS — J343 Hypertrophy of nasal turbinates: Secondary | ICD-10-CM | POA: Insufficient documentation

## 2017-04-20 DIAGNOSIS — J353 Hypertrophy of tonsils with hypertrophy of adenoids: Secondary | ICD-10-CM | POA: Diagnosis present

## 2017-04-20 DIAGNOSIS — G4733 Obstructive sleep apnea (adult) (pediatric): Secondary | ICD-10-CM | POA: Insufficient documentation

## 2017-04-20 DIAGNOSIS — G4730 Sleep apnea, unspecified: Secondary | ICD-10-CM | POA: Diagnosis present

## 2017-04-20 HISTORY — PX: TONSILLECTOMY/ADENOIDECTOMY/TURBINATE REDUCTION: SHX6126

## 2017-04-20 LAB — POCT PREGNANCY, URINE: Preg Test, Ur: NEGATIVE

## 2017-04-20 SURGERY — TONSILLECTOMY AND ADENOIDECTOMY, WITH NASAL TURBINATE PARTIAL EXCISION
Anesthesia: General | Site: Throat | Laterality: Bilateral

## 2017-04-20 MED ORDER — CEFAZOLIN SODIUM-DEXTROSE 2-4 GM/100ML-% IV SOLN
INTRAVENOUS | Status: AC
  Filled 2017-04-20: qty 100

## 2017-04-20 MED ORDER — DEXTROSE-NACL 5-0.45 % IV SOLN
INTRAVENOUS | Status: DC
  Administered 2017-04-20: 20:00:00 via INTRAVENOUS

## 2017-04-20 MED ORDER — OXYMETAZOLINE HCL 0.05 % NA SOLN
2.0000 | NASAL | Status: DC | PRN
  Administered 2017-04-20: 1 via NASAL

## 2017-04-20 MED ORDER — OXYCODONE HCL 5 MG PO TABS: 5.0000 mg | ORAL_TABLET | Freq: Once | ORAL | Status: DC | PRN

## 2017-04-20 MED ORDER — DEXAMETHASONE SODIUM PHOSPHATE 10 MG/ML IJ SOLN
10.0000 mg | Freq: Once | INTRAMUSCULAR | Status: AC
  Administered 2017-04-20: 10 mg via INTRAVENOUS

## 2017-04-20 MED ORDER — CEFAZOLIN SODIUM-DEXTROSE 2-4 GM/100ML-% IV SOLN
2.0000 g | INTRAVENOUS | Status: AC
  Administered 2017-04-20: 2 g via INTRAVENOUS

## 2017-04-20 MED ORDER — CEPHALEXIN 250 MG/5ML PO SUSR
500.0000 mg | Freq: Four times a day (QID) | ORAL | Status: DC
  Administered 2017-04-20 – 2017-04-21 (×3): 500 mg via ORAL
  Filled 2017-04-20 (×6): qty 10

## 2017-04-20 MED ORDER — SUGAMMADEX SODIUM 200 MG/2ML IV SOLN
INTRAVENOUS | Status: DC | PRN
  Administered 2017-04-20: 200 mg via INTRAVENOUS

## 2017-04-20 MED ORDER — ONDANSETRON HCL 4 MG PO TABS: 4.0000 mg | ORAL_TABLET | ORAL | Status: DC | PRN

## 2017-04-20 MED ORDER — DEXAMETHASONE SODIUM PHOSPHATE 10 MG/ML IJ SOLN
INTRAMUSCULAR | Status: AC
  Filled 2017-04-20: qty 1

## 2017-04-20 MED ORDER — PHENYLEPHRINE 40 MCG/ML (10ML) SYRINGE FOR IV PUSH (FOR BLOOD PRESSURE SUPPORT)
PREFILLED_SYRINGE | INTRAVENOUS | Status: DC | PRN
  Administered 2017-04-20: 80 ug via INTRAVENOUS

## 2017-04-20 MED ORDER — OXYCODONE HCL 5 MG/5ML PO SOLN
5.0000 mg | Freq: Once | ORAL | Status: DC | PRN
Start: 2017-04-20 — End: 2017-04-20

## 2017-04-20 MED ORDER — PROPOFOL 10 MG/ML IV BOLUS
INTRAVENOUS | Status: AC
  Filled 2017-04-20: qty 20

## 2017-04-20 MED ORDER — OXYCODONE HCL 5 MG/5ML PO SOLN
10.0000 mg | Freq: Once | ORAL | Status: AC
  Administered 2017-04-21: 10 mg via ORAL
  Filled 2017-04-20: qty 10

## 2017-04-20 MED ORDER — FENTANYL CITRATE (PF) 100 MCG/2ML IJ SOLN
INTRAMUSCULAR | Status: DC | PRN
  Administered 2017-04-20 (×3): 50 ug via INTRAVENOUS
  Administered 2017-04-20: 100 ug via INTRAVENOUS

## 2017-04-20 MED ORDER — ONDANSETRON HCL 4 MG/2ML IJ SOLN: 4.0000 mg | INTRAMUSCULAR | Status: DC | PRN

## 2017-04-20 MED ORDER — LIDOCAINE-EPINEPHRINE 0.5 %-1:200000 IJ SOLN
INTRAMUSCULAR | Status: DC | PRN
  Administered 2017-04-20: 5 mL
  Administered 2017-04-20: 10 mL

## 2017-04-20 MED ORDER — LACTATED RINGERS IV SOLN
INTRAVENOUS | Status: DC | PRN
  Administered 2017-04-20: 07:00:00 via INTRAVENOUS

## 2017-04-20 MED ORDER — ONDANSETRON HCL 4 MG/2ML IJ SOLN: 4.0000 mg | Freq: Once | INTRAMUSCULAR | Status: DC | PRN

## 2017-04-20 MED ORDER — LIDOCAINE 2% (20 MG/ML) 5 ML SYRINGE
INTRAMUSCULAR | Status: DC | PRN
  Administered 2017-04-20: 40 mg via INTRAVENOUS

## 2017-04-20 MED ORDER — OXYCODONE HCL 5 MG/5ML PO SOLN
ORAL | Status: AC
  Administered 2017-04-20: 10 mg via ORAL
  Filled 2017-04-20: qty 10

## 2017-04-20 MED ORDER — FENTANYL CITRATE (PF) 250 MCG/5ML IJ SOLN
INTRAMUSCULAR | Status: AC
  Filled 2017-04-20: qty 5

## 2017-04-20 MED ORDER — ONDANSETRON HCL 4 MG/2ML IJ SOLN
INTRAMUSCULAR | Status: AC
  Filled 2017-04-20: qty 2

## 2017-04-20 MED ORDER — MIDAZOLAM HCL 2 MG/2ML IJ SOLN
INTRAMUSCULAR | Status: AC
  Filled 2017-04-20: qty 2

## 2017-04-20 MED ORDER — PROPOFOL 10 MG/ML IV BOLUS
INTRAVENOUS | Status: DC | PRN
  Administered 2017-04-20: 160 mg via INTRAVENOUS

## 2017-04-20 MED ORDER — 0.9 % SODIUM CHLORIDE (POUR BTL) OPTIME
TOPICAL | Status: DC | PRN
  Administered 2017-04-20: 1000 mL

## 2017-04-20 MED ORDER — OXYMETAZOLINE HCL 0.05 % NA SOLN
NASAL | Status: AC
  Administered 2017-04-20: 1
  Filled 2017-04-20: qty 15

## 2017-04-20 MED ORDER — OXYMETAZOLINE HCL 0.05 % NA SOLN
NASAL | Status: DC | PRN
  Administered 2017-04-20: 1

## 2017-04-20 MED ORDER — OXYCODONE HCL 5 MG/5ML PO SOLN
5.0000 mg | ORAL | Status: DC | PRN
  Administered 2017-04-20 – 2017-04-21 (×4): 10 mg via ORAL
  Filled 2017-04-20 (×3): qty 10

## 2017-04-20 MED ORDER — OXYCODONE HCL 5 MG/5ML PO SOLN: 5.0000 mg | Freq: Once | ORAL | Status: DC | PRN

## 2017-04-20 MED ORDER — ROCURONIUM BROMIDE 10 MG/ML (PF) SYRINGE
PREFILLED_SYRINGE | INTRAVENOUS | Status: DC | PRN
  Administered 2017-04-20: 10 mg via INTRAVENOUS
  Administered 2017-04-20: 50 mg via INTRAVENOUS

## 2017-04-20 MED ORDER — FENTANYL CITRATE (PF) 100 MCG/2ML IJ SOLN
25.0000 ug | INTRAMUSCULAR | Status: DC | PRN
Start: 2017-04-20 — End: 2017-04-20

## 2017-04-20 MED ORDER — MIDAZOLAM HCL 5 MG/5ML IJ SOLN
INTRAMUSCULAR | Status: DC | PRN
  Administered 2017-04-20: 2 mg via INTRAVENOUS

## 2017-04-20 MED ORDER — IBUPROFEN 100 MG/5ML PO SUSP
400.0000 mg | Freq: Four times a day (QID) | ORAL | Status: DC | PRN
  Administered 2017-04-20: 400 mg via ORAL
  Filled 2017-04-20 (×4): qty 20

## 2017-04-20 MED ORDER — FENTANYL CITRATE (PF) 100 MCG/2ML IJ SOLN
INTRAMUSCULAR | Status: AC
  Administered 2017-04-20: 50 ug via INTRAVENOUS
  Filled 2017-04-20: qty 2

## 2017-04-20 MED ORDER — SUGAMMADEX SODIUM 200 MG/2ML IV SOLN
INTRAVENOUS | Status: AC
  Filled 2017-04-20: qty 2

## 2017-04-20 MED ORDER — BACITRACIN ZINC 500 UNIT/GM EX OINT
TOPICAL_OINTMENT | CUTANEOUS | Status: AC
  Filled 2017-04-20: qty 28.35

## 2017-04-20 MED ORDER — ACETAMINOPHEN 160 MG/5ML PO SOLN
325.0000 mg | ORAL | Status: DC | PRN
  Administered 2017-04-21: 650 mg via ORAL
  Filled 2017-04-20: qty 20.3

## 2017-04-20 MED ORDER — LIDOCAINE-EPINEPHRINE 0.5 %-1:200000 IJ SOLN
INTRAMUSCULAR | Status: AC
  Filled 2017-04-20: qty 1

## 2017-04-20 MED ORDER — FENTANYL CITRATE (PF) 100 MCG/2ML IJ SOLN
25.0000 ug | INTRAMUSCULAR | Status: DC | PRN
  Administered 2017-04-20 (×2): 50 ug via INTRAVENOUS

## 2017-04-20 MED ORDER — OXYCODONE HCL 5 MG/5ML PO SOLN: 5.0000 mg | ORAL | Status: DC | PRN

## 2017-04-20 MED ORDER — ONDANSETRON HCL 4 MG/2ML IJ SOLN
INTRAMUSCULAR | Status: DC | PRN
  Administered 2017-04-20: 4 mg via INTRAVENOUS

## 2017-04-20 SURGICAL SUPPLY — 42 items
BLADE SURG 15 STRL LF DISP TIS (BLADE) ×1 IMPLANT
BLADE SURG 15 STRL SS (BLADE) ×1
CANISTER SUCT 3000ML PPV (MISCELLANEOUS) ×2 IMPLANT
CATH ROBINSON RED A/P 10FR (CATHETERS) ×2 IMPLANT
CLEANER TIP ELECTROSURG 2X2 (MISCELLANEOUS) ×2 IMPLANT
COAGULATOR SUCT SWTCH 10FR 6 (ELECTROSURGICAL) ×2 IMPLANT
CRADLE DONUT ADULT HEAD (MISCELLANEOUS) IMPLANT
DECANTER SPIKE VIAL GLASS SM (MISCELLANEOUS) ×2 IMPLANT
DRSG TELFA 3X8 NADH (GAUZE/BANDAGES/DRESSINGS) ×2 IMPLANT
ELECT COATED BLADE 2.86 ST (ELECTRODE) ×2 IMPLANT
ELECT REM PT RETURN 9FT ADLT (ELECTROSURGICAL)
ELECT REM PT RETURN 9FT PED (ELECTROSURGICAL)
ELECTRODE REM PT RETRN 9FT PED (ELECTROSURGICAL) IMPLANT
ELECTRODE REM PT RTRN 9FT ADLT (ELECTROSURGICAL) IMPLANT
FORCEPS TISS BAYO ENTCEPS (INSTRUMENTS) ×2 IMPLANT
GAUZE SPONGE 4X4 16PLY XRAY LF (GAUZE/BANDAGES/DRESSINGS) ×2 IMPLANT
GLOVE ECLIPSE 8.0 STRL XLNG CF (GLOVE) ×4 IMPLANT
GOWN STRL REUS W/ TWL LRG LVL3 (GOWN DISPOSABLE) IMPLANT
GOWN STRL REUS W/ TWL XL LVL3 (GOWN DISPOSABLE) ×2 IMPLANT
GOWN STRL REUS W/TWL LRG LVL3 (GOWN DISPOSABLE)
GOWN STRL REUS W/TWL XL LVL3 (GOWN DISPOSABLE) ×2
KIT BASIN OR (CUSTOM PROCEDURE TRAY) ×2 IMPLANT
KIT TURNOVER KIT B (KITS) ×2 IMPLANT
NEEDLE HYPO 25GX1X1/2 BEV (NEEDLE) IMPLANT
NEEDLE SPNL 22GX3.5 QUINCKE BK (NEEDLE) ×4 IMPLANT
NEEDLE SPNL 25GX3.5 QUINCKE BL (NEEDLE) ×2 IMPLANT
NS IRRIG 1000ML POUR BTL (IV SOLUTION) ×2 IMPLANT
PACK SURGICAL SETUP 50X90 (CUSTOM PROCEDURE TRAY) ×2 IMPLANT
PAD ARMBOARD 7.5X6 YLW CONV (MISCELLANEOUS) ×4 IMPLANT
PATTIES SURGICAL .5 X3 (DISPOSABLE) ×2 IMPLANT
PENCIL FOOT CONTROL (ELECTRODE) ×2 IMPLANT
SOLUTION BUTLER CLEAR DIP (MISCELLANEOUS) ×2 IMPLANT
SPECIMEN JAR SMALL (MISCELLANEOUS) ×4 IMPLANT
SPONGE TONSIL 1 RF SGL (DISPOSABLE) ×2 IMPLANT
SYR BULB 3OZ (MISCELLANEOUS) ×2 IMPLANT
SYR CONTROL 10ML LL (SYRINGE) ×2 IMPLANT
TOWEL OR 17X24 6PK STRL BLUE (TOWEL DISPOSABLE) ×4 IMPLANT
TUBE CONNECTING 12X1/4 (SUCTIONS) ×2 IMPLANT
TUBE SALEM SUMP 14F W/ARV (TUBING) IMPLANT
TUBE SALEM SUMP 16 FR W/ARV (TUBING) IMPLANT
WATER STERILE IRR 1000ML POUR (IV SOLUTION) ×2 IMPLANT
YANKAUER SUCT BULB TIP NO VENT (SUCTIONS) ×2 IMPLANT

## 2017-04-20 NOTE — Transfer of Care (Signed)
Immediate Anesthesia Transfer of Care Note  Patient: [name]  Procedure(s) Performed: TONSILLECTOMY/ADENOIDECTOMY AND BILATERAL TURBINATE REDUCTION (Bilateral Throat)  Patient Location: PACU  Anesthesia Type:General  Level of Consciousness: drowsy and patient cooperative  Airway & Oxygen Therapy: Patient Spontanous Breathing and Patient connected to nasal cannula oxygen  Post-op Assessment: Report given to RN, Post -op Vital signs reviewed and stable and Patient moving all extremities  Post vital signs: Reviewed and stable  Last Vitals:  Vitals Value Taken Time  BP    Temp 36.7 C 04/20/2017  9:23 AM  Pulse 90 04/20/2017  9:25 AM  Resp 14 04/20/2017  9:25 AM  SpO2 94 % 04/20/2017  9:25 AM  Vitals shown include unvalidated device data.  Last Pain:  Vitals:   04/20/17 0540  TempSrc: Oral      Patients Stated Pain Goal: 3 (04/20/17 0555)  Complications: No apparent anesthesia complications

## 2017-04-20 NOTE — Anesthesia Procedure Notes (Signed)
Procedure Name: Intubation Date/Time: 04/20/2017 7:48 AM Performed by: Moshe Salisbury, CRNA Pre-anesthesia Checklist: Patient identified, Emergency Drugs available, Suction available and Patient being monitored Patient Re-evaluated:Patient Re-evaluated prior to induction Oxygen Delivery Method: Circle System Utilized Preoxygenation: Pre-oxygenation with 100% oxygen Induction Type: IV induction Ventilation: Mask ventilation without difficulty Laryngoscope Size: Mac and 3 Grade View: Grade I Tube type: Oral Rae Tube size: 7.5 mm Number of attempts: 1 Airway Equipment and Method: Stylet Placement Confirmation: ETT inserted through vocal cords under direct vision,  positive ETCO2 and breath sounds checked- equal and bilateral Secured at: 21 cm Tube secured with: Tape Dental Injury: Teeth and Oropharynx as per pre-operative assessment

## 2017-04-20 NOTE — Anesthesia Preprocedure Evaluation (Addendum)
Anesthesia Evaluation  Patient identified by MRN, date of birth, ID band Patient awake    Reviewed: Allergy & Precautions, NPO status , Patient's Chart, lab work & pertinent test results  Airway Mallampati: II  TM Distance: >3 FB Neck ROM: Full    Dental  (+) Teeth Intact, Dental Advisory Given   Pulmonary    breath sounds clear to auscultation       Cardiovascular  Rhythm:Regular Rate:Normal     Neuro/Psych    GI/Hepatic   Endo/Other    Renal/GU      Musculoskeletal   Abdominal (+) + obese,   Peds  Hematology   Anesthesia Other Findings   Reproductive/Obstetrics                             Anesthesia Physical Anesthesia Plan  ASA: III  Anesthesia Plan: General   Post-op Pain Management:    Induction: Intravenous  PONV Risk Score and Plan: 1 and Ondansetron and Dexamethasone  Airway Management Planned: Oral ETT  Additional Equipment:   Intra-op Plan:   Post-operative Plan: Extubation in OR  Informed Consent: I have reviewed the patients History and Physical, chart, labs and discussed the procedure including the risks, benefits and alternatives for the proposed anesthesia with the patient or authorized representative who has indicated his/her understanding and acceptance.   Dental advisory given  Plan Discussed with: CRNA and Anesthesiologist  Anesthesia Plan Comments:         Anesthesia Quick Evaluation

## 2017-04-20 NOTE — Interval H&P Note (Signed)
History and Physical Interval Note:  04/20/2017 7:39 AM  Jenna Blair  has presented today for surgery, with the diagnosis of HYPERTROPHIC TONSILS ADENOIDS TURBINATES AND OBSTRUCTIVE SLEEP APNEA  The various methods of treatment have been discussed with the patient and family. After consideration of risks, benefits and other options for treatment, the patient has consented to  Procedure(s): TONSILLECTOMY/ADENOIDECTOMY AND BILATERAL TURBINATE REDUCTION (Bilateral) as a surgical intervention .  The patient's history has been re-reviewed, patient re-examined, no change in status, stable for surgery.  I have re-reviewed the patient's chart and labs.  Questions were answered to the patient's satisfaction.     Flo ShanksWOLICKI, Maui Britten

## 2017-04-20 NOTE — Anesthesia Postprocedure Evaluation (Signed)
Anesthesia Post Note  Patient: Jenna Blair  Procedure(s) Performed: TONSILLECTOMY/ADENOIDECTOMY AND BILATERAL TURBINATE REDUCTION (Bilateral Throat)     Patient location during evaluation: PACU Anesthesia Type: General Level of consciousness: awake and alert Pain management: pain level controlled Vital Signs Assessment: post-procedure vital signs reviewed and stable Respiratory status: spontaneous breathing, nonlabored ventilation, respiratory function stable and patient connected to nasal cannula oxygen Cardiovascular status: blood pressure returned to baseline and stable Postop Assessment: no apparent nausea or vomiting Anesthetic complications: no    Last Vitals:  Vitals:   04/20/17 1600 04/20/17 1615  BP: 118/78   Pulse: 80 89  Resp: 17 (!) 22  Temp:    SpO2: 98% 97%    Last Pain:  Vitals:   04/20/17 1545  TempSrc:   PainSc: Asleep                 Talayia Hjort COKER

## 2017-04-20 NOTE — Op Note (Signed)
04/20/2017  9:26 AM    Jenna Blair, Jenna Blair  [id number]   Pre-Op Dx:  Obstructive sleep apnea. Hypertrophic inferior turbinates. Obstructive adenotonsillar hypertrophy.  Post-op Dx:  same  Proc: T&A, SMR inferior turbinates   Surg:  Flo ShanksWOLICKI, Adrienne Trombetta T MD  Anes:  GOT  EBL:  20 ml  Comp:  none  Findings:  Bulky bony inferior turbinates with narrow nose overall.  Bulky tonsils.  Fleshy oropharynx and nasopharynx.    Procedure:  With the patient in a comfortable supine position,  general orotracheal anesthesia was induced without difficulty.   A routine surgical timeout was performed.   At an appropriate level, the patient was turned 90 away from anesthesia.  Nose was inspected with the findings as described above. 1/2% Xylocaine with 1-200,000 epinephrine was infiltrated into the inferior turbinates using a 25-gauge spinal needle.Afrin solution on cottonoids was placed into both sides of the nose.  The patient was placed in Trendelenburg.  A clean preparation and draping was accomplished.  Taking care to protect lips, teeth, and endotracheal tube, the Crowe-Davis mouth gag was introduced, expanded for visualization, and suspended from the Mayo stand in the standard fashion.  The findings were as described above.     Anterior nose was examined with a nasal speculum with the findings as described above.  1/2% Xylocaine with 1:200,000 epinephrine, 10 cc's, was infiltrated into the peritonsillar planes on both sides for intraoperative hemostasis.  Several minutes were allowed for this to take effect.   Beginning on the  RIGHT side, the tonsil was grasped and retracted medially.  The mucosa over the anterior and superior poles was coagulated and then cut down to the capsule of the tonsil using the Microline thermal forceps.  Using the forceps tip as a blunt dissector, the tonsil was dissected from its muscular fossa from anterior to posterior and from superior to inferior.  Fibrous bands were  lysed as necessary.  Crossing vessels were coagulated as identified.  The tonsil was removed in its entirety as determined by examination of both tonsil and fossa.  A small additional quantity of cautery rendered the fossa hemostatic.    After completing the 1st tonsillectomy, the 2nd one was performed in identical fashion.  After completing both tonsillectomies,  a red rubber catheter was passed through the nose and out the mouth to serve as a palate retractor.  Using suction cautery and indirect visualization, the adenoids were suctioned coagulated.  Upon achieving hemostasis in the nasopharynx, the oropharynx was again observed to be hemostatic.    At this point the palate retractor and mouthgag were relaxed for several minutes.  Upon reexpansion,  Hemostasis was observed.  The mouth gag and palate retractor were relaxed and removed.  The dental status was intact.   At this point the patient was turned back to anesthesia and placed in a semisitting position.  The materials were removed from the nose and observed to be intact and correct in number. Once again the findings were as described above.  Beginning on the right side, the anterior hood of the inferior turbinate was sharply lysed. The medial mucosa was lysed in an anterior upsloping fashion and a laterally based flap was developed. The turbinate was infractured. Using angled turbinate scissors, the turbinate bone and lateral mucosa was submucosally resected in a posterior downsloping fashion taking virtually all the anterior pole and leaving most of the posterior pole. Bony spicules were removed. The mucosal flap was laid back down, the turbinate was outfractured, and  the cut mucosal edges were suction cauterized.  The left side was done in identical fashion.  Triple thickness Telfa packs impregnated with bacitracin ointment were applied against the inferior turbinate on both sides. A 6.5 mm nasal trumpet was shortened to the nasopharynx and  placed between the packing and the septum on both sides. Hemostasis was observed at all sites.  At this point the procedure was completed.  The patient was returned to anesthesia, awakened, extubated, and transferred to recovery in stable condition.  Dispo:  OR to PACU.   Will observe overnight given known sleep apnea. We will remove the packing in the morning and discharge home in care of family.  Plan:  Analgesia, hydration, limited activity for two weeks.  Advance diet as comfortable. Nasal hygiene measures.  Return to  work at 14 days.  Cephus Richer  MD.

## 2017-04-20 NOTE — Progress Notes (Signed)
Patient ID: Jenna Blair, female   DOB: 07/15/1985, 32 y.o.   MRN: [id number]  Postop check  She still in PACU.  The room was just assigned.  She is awake and alert.  Nasal trumpets are in place.  There is no bleeding.  Pharynx is clear.  She is sucking on ice chips.  Stable postop.  Continue overnight care in ICU.

## 2017-04-21 ENCOUNTER — Encounter (HOSPITAL_COMMUNITY): Payer: Self-pay | Admitting: Otolaryngology

## 2017-04-21 DIAGNOSIS — J353 Hypertrophy of tonsils with hypertrophy of adenoids: Secondary | ICD-10-CM | POA: Diagnosis not present

## 2017-04-21 MED FILL — oxyCODONE HCL 5 MG/5ML SOLN: 5 | 5 days supply | Qty: 240 | Fill #0

## 2017-04-21 NOTE — Discharge Instructions (Signed)
Keep head elevated 3-4 nights See tonsillectomy instructions from my office Recheck my office 8 days, [phone] No strenuous activity x 2 weeks Begin nasal hygiene instructions from my office Call Amy  [phone] if you cannot find your instructions for nasal care and for tonsillectomy, or your prescriptions.  We will change your appointment to Friday, 5 APR

## 2017-04-21 NOTE — Progress Notes (Signed)
Pt discharged per MD, discharge instructions reviewed and all questions reviewed.

## 2017-04-21 NOTE — Discharge Summary (Signed)
04/21/2017 8:46 AM  Jenna Blair, Elmo [id number]  Post-Op Day 1 discharge summary    Temp:  [97.8 F (36.6 C)-98.1 F (36.7 C)] 98 F (36.7 C) (03/28 0716) Pulse Rate:  [65-93] 77 (03/28 0716) Resp:  [10-27] 12 (03/28 0716) BP: (118-159)/(78-107) 129/85 (03/28 0716) SpO2:  [90 %-100 %] 98 % (03/28 0716) FiO2 (%):  [32 %] 32 % (03/27 1723) Weight:  [111.1 kg (244 lb 14.9 oz)] 111.1 kg (244 lb 14.9 oz) (03/27 1902),     Intake/Output Summary (Last 24 hours) at 04/21/2017 0846 Last data filed at 04/21/2017 0300 Gross per 24 hour  Intake 2031.42 ml  Output 50 ml  Net 1981.42 ml    No results found for this or any previous visit (from the past 24 hour(s)).  SUBJECTIVE:  Pain reasonably controlled.  Taking liquids OK.  spont void.  No bleeding  OBJECTIVE:   Energy OK.  Nasal packs and tubes removed.  Min bleeding.  Pharynx clearn  IMPRESSION:  Satisfactory check  PLAN:  Discharge to home and care of family  Admit:27 MAR Discharge:  28 MAR Final Diagnosis:  OSA, obstructive adenotonsillar hypertrophy.  Obstructive turbinate hypertrophy. Proc:  T&A, bilateral SMR inferior turbinates, 27 MAR Comp:  None Cond:  Ambulatory.  Breathing well.  Taking good po no bleeding.  Nasal packs removed. Rx:  Oxycodone liquid Recheck:  My office, 8 days  Instructions written and given  Hosp Course:  Underwent surgery without comp.  Observed 23 hr overnight given known OSA.  Packs removed AM POD 1.  Discharged to home and care of family.   Flo ShanksWOLICKI, Andrena Margerum

## 2017-07-08 ENCOUNTER — Encounter: Payer: Self-pay | Admitting: Obstetrics

## 2017-07-08 ENCOUNTER — Ambulatory Visit (INDEPENDENT_AMBULATORY_CARE_PROVIDER_SITE_OTHER): Payer: Managed Care, Other (non HMO) | Admitting: Obstetrics

## 2017-07-08 VITALS — BP 130/86 | HR 72 | Ht 60.0 in | Wt 243.0 lb

## 2017-07-08 DIAGNOSIS — N898 Other specified noninflammatory disorders of vagina: Secondary | ICD-10-CM

## 2017-07-08 DIAGNOSIS — Z1151 Encounter for screening for human papillomavirus (HPV): Secondary | ICD-10-CM

## 2017-07-08 DIAGNOSIS — Z3046 Encounter for surveillance of implantable subdermal contraceptive: Secondary | ICD-10-CM

## 2017-07-08 DIAGNOSIS — Z01419 Encounter for gynecological examination (general) (routine) without abnormal findings: Secondary | ICD-10-CM

## 2017-07-08 DIAGNOSIS — Z124 Encounter for screening for malignant neoplasm of cervix: Secondary | ICD-10-CM

## 2017-07-08 DIAGNOSIS — Z113 Encounter for screening for infections with a predominantly sexual mode of transmission: Secondary | ICD-10-CM

## 2017-07-08 NOTE — Progress Notes (Signed)
Subjective:        Jenna Blair is a 32 y.o. female here for a routine exam.  Current complaints: None.    Personal health questionnaire:  Is patient [name], have a family history of breast and/or ovarian cancer: no Is there a family history of uterine cancer diagnosed at age < 5850, gastrointestinal cancer, urinary tract cancer, family member who is a Personnel officerLynch syndrome-associated carrier: no Is the patient overweight and hypertensive, family history of diabetes, personal history of gestational diabetes, preeclampsia or PCOS: no Is patient over 5955, have PCOS,  family history of premature CHD under age 31, diabetes, smoke, have hypertension or peripheral artery disease:  no At any time, has a partner hit, kicked or otherwise hurt or frightened you?: no Over the past 2 weeks, have you felt down, depressed or hopeless?: no Over the past 2 weeks, have you felt little interest or pleasure in doing things?:no   Gynecologic History No LMP recorded. Patient has had an implant. Contraception: Nexplanon Last Pap: 2018. Results were: normal Last mammogram: n/a. Results were: n/a  Obstetric History OB History  Gravida Para Term Preterm AB Living  1         1  SAB TAB Ectopic Multiple Live Births               # Outcome Date GA Lbr Len/2nd Weight Sex Delivery Anes PTL Lv  1 Gravida             Past Medical History:  Diagnosis Date  . Depression   . Environmental allergies   . Sleep apnea    Uses C Pap nightly    Past Surgical History:  Procedure Laterality Date  . CESAREAN SECTION     x1  . LEG SURGERY Right    Injury as a child  . TONSILLECTOMY/ADENOIDECTOMY/TURBINATE REDUCTION Bilateral 04/20/2017   Procedure: TONSILLECTOMY/ADENOIDECTOMY AND BILATERAL TURBINATE REDUCTION;  Surgeon: Flo ShanksWolicki, Karol, MD;  [facility];  Service: ENT;  Laterality: Bilateral;  . TOOTH EXTRACTION       Current Outpatient Medications:  .  ipratropium (ATROVENT) 0.03 % nasal spray,  Place 2 sprays into both nostrils every 12 (twelve) hours., Disp: , Rfl:  .  phentermine (ADIPEX-P) 37.5 MG tablet, Take 37.5 mg by mouth daily., Disp: , Rfl: 0 .  topiramate (TOPAMAX) 25 MG tablet, Take 25 mg by mouth daily., Disp: , Rfl: 0 .  cetirizine (ZYRTEC) 10 MG tablet, Take 1 tablet (10 mg total) by mouth daily. (Patient not taking: Reported on 01/07/2017), Disp: 30 tablet, Rfl: 1 .  hydrocortisone (ANUSOL-HC) 25 MG suppository, Use 1 suppository at bedtime for 10 days. (Patient not taking: Reported on 04/11/2017), Disp: 12 suppository, Rfl: 0 .  hydrOXYzine (ATARAX/VISTARIL) 10 MG tablet, Take 1 tablet (10 mg total) by mouth 3 (three) times daily as needed. (Patient not taking: Reported on 01/07/2017), Disp: 30 tablet, Rfl: 0 No Known Allergies  Social History   Tobacco Use  . Smoking status: Never Smoker  . Smokeless tobacco: Never Used  Substance Use Topics  . Alcohol use: Yes    Comment: occasional    Family History  Problem Relation Age of Onset  . Diabetes Other   . Heart disease Other   . Hyperlipidemia Other   . Hypertension Other   . Stroke Other   . Thyroid disease Other   . Diabetes Maternal Aunt   . Diabetes Paternal Aunt       Review of Systems  Constitutional: negative for fatigue and weight loss Respiratory: negative for cough and wheezing Cardiovascular: negative for chest pain, fatigue and palpitations Gastrointestinal: negative for abdominal pain and change in bowel habits Musculoskeletal:negative for myalgias Neurological: negative for gait problems and tremors Behavioral/Psych: negative for abusive relationship, depression Endocrine: negative for temperature intolerance    Genitourinary:negative for abnormal menstrual periods, genital lesions, hot flashes, sexual problems and vaginal discharge Integument/breast: negative for breast lump, breast tenderness, nipple discharge and skin lesion(s)    Objective:       BP 130/86   Pulse 72   Ht 5'  (1.524 m)   Wt 243 lb (110.2 kg)   BMI 47.46 kg/m  General:   alert  Skin:   no rash or abnormalities  Lungs:   clear to auscultation bilaterally  Heart:   regular rate and rhythm, S1, S2 normal, no murmur, click, rub or gallop  Breasts:   normal without suspicious masses, skin or nipple changes or axillary nodes  Abdomen:  normal findings: no organomegaly, soft, non-tender and no hernia  Pelvis:  External genitalia: normal general appearance Urinary system: urethral meatus normal and bladder without fullness, nontender Vaginal: normal without tenderness, induration or masses Cervix: normal appearance Adnexa: normal bimanual exam Uterus: anteverted and non-tender, normal size   Lab Review Urine pregnancy test Labs reviewed yes Radiologic studies reviewed no  50% of 20 min visit spent on counseling and coordination of care.   Assessment:     1. Encounter for annual routine gynecological examination  2. Screening for cervical cancer Rx: - Cytology - PAP  3. Vaginal discharge Rx: - Cervicovaginal ancillary only  4. Screening examination for STD (sexually transmitted disease) Rx: - Hepatitis B surface antigen - Hepatitis C antibody - RPR - HIV antibody  5. Encounter for surveillance of implantable subdermal contraceptive - pleased with Nexplanon     Plan:    Education reviewed: calcium supplements, depression evaluation, low fat, low cholesterol diet, safe sex/STD prevention, self breast exams and weight bearing exercise. Contraception: Nexplanon. Follow up in: 1 year.   No orders of the defined types were placed in this encounter.  No orders of the defined types were placed in this encounter.   Brock Bad MD 07-08-2017

## 2017-07-08 NOTE — Progress Notes (Signed)
Patient presents for her Annual Exam today.   LMP:no periods due to implant  Pap: 07/07/2016 WNL  Contraception: Nexplanon  STD Screening: Full panel   CC: None per pt

## 2017-07-09 LAB — HEPATITIS B SURFACE ANTIGEN: Hepatitis B Surface Ag: NEGATIVE

## 2017-07-09 LAB — RPR: RPR Ser Ql: NONREACTIVE

## 2017-07-09 LAB — HIV ANTIBODY (ROUTINE TESTING W REFLEX): HIV Screen 4th Generation wRfx: NONREACTIVE

## 2017-07-09 LAB — HEPATITIS C ANTIBODY: Hep C Virus Ab: <0.1 s/co ratio (ref 0.0–0.9)

## 2017-07-12 LAB — CERVICOVAGINAL ANCILLARY ONLY
Bacterial vaginitis: POSITIVE — AB
Candida vaginitis: POSITIVE — AB
Chlamydia: NEGATIVE
Neisseria Gonorrhea: NEGATIVE
Trichomonas: NEGATIVE

## 2017-07-14 LAB — CYTOLOGY - PAP
Diagnosis: NEGATIVE
HPV 16/18/45 genotyping: NEGATIVE
HPV: DETECTED — AB

## 2017-07-15 ENCOUNTER — Other Ambulatory Visit: Payer: Self-pay | Admitting: Obstetrics

## 2017-07-15 DIAGNOSIS — N760 Acute vaginitis: Secondary | ICD-10-CM

## 2017-07-15 DIAGNOSIS — B9689 Other specified bacterial agents as the cause of diseases classified elsewhere: Secondary | ICD-10-CM

## 2017-07-15 DIAGNOSIS — B3731 Acute candidiasis of vulva and vagina: Secondary | ICD-10-CM

## 2017-07-15 DIAGNOSIS — B373 Candidiasis of vulva and vagina: Secondary | ICD-10-CM

## 2017-07-15 MED ORDER — FLUCONAZOLE 150 MG PO TABS: 150.0000 mg | ORAL_TABLET | Freq: Once | ORAL | 0 refills | Status: AC

## 2017-07-15 MED ORDER — TINIDAZOLE 500 MG PO TABS
1000.0000 mg | ORAL_TABLET | Freq: Every day | ORAL | 2 refills | Status: DC
Start: 2017-07-15 — End: 2018-02-28

## 2018-02-28 ENCOUNTER — Ambulatory Visit (HOSPITAL_COMMUNITY)
Admission: EM | Admit: 2018-02-28 | Discharge: 2018-02-28 | Disposition: A | Discharge status: Home / Self Care | Payer: Managed Care, Other (non HMO) | Attending: Family Medicine | Admitting: Family Medicine

## 2018-02-28 ENCOUNTER — Encounter (HOSPITAL_COMMUNITY): Payer: Self-pay | Admitting: Emergency Medicine

## 2018-02-28 DIAGNOSIS — L509 Urticaria, unspecified: Secondary | ICD-10-CM

## 2018-02-28 MED ORDER — CETIRIZINE HCL 10 MG PO TABS: 10.0000 mg | ORAL_TABLET | Freq: Two times a day (BID) | ORAL | 0 refills | Status: DC

## 2018-02-28 MED ORDER — METHYLPREDNISOLONE 4 MG PO TBPK: ORAL_TABLET | ORAL | 0 refills | Status: DC

## 2018-02-28 NOTE — ED Provider Notes (Signed)
[facility]    CSN: [id number] Arrival date & time: 02/28/18  7867     History   Chief Complaint Chief Complaint  Patient presents with  . Urticaria    HPI Jenna Blair is a 33 y.o. female.   HPI  Patient is here with hives.  This is a recurring problem.  She states it is happened many times.  Sometimes it will get better with 1 dose of Benadryl.  This time she will take Benadryl, then every time it wears off it comes back.  She is getting new lesions.  They itch terribly.  The Benadryl, she does not think, is working as well as it used to.  She has never figured out what causes these.  No new food.  No new lotion or product.  No new medicine.  Denies stress.  Advised her to see an allergist if she continues to have recurring symptoms.  She is never had shortness of breath, lip swelling, trouble breathing or swallowing  Past Medical History:  Diagnosis Date  . Depression   . Environmental allergies   . Sleep apnea    Uses C Pap nightly    Patient Active Problem List   Diagnosis Date Noted  . Sleep apnea 04/20/2017  . Bleeding internal hemorrhoids 01/07/2017  . Morbid obesity (HCC) 12/03/2014  . OSA (obstructive sleep apnea) 10/22/2014    Past Surgical History:  Procedure Laterality Date  . CESAREAN SECTION     x1  . LEG SURGERY Right    Injury as a child  . TONSILLECTOMY/ADENOIDECTOMY/TURBINATE REDUCTION Bilateral 04/20/2017   Procedure: TONSILLECTOMY/ADENOIDECTOMY AND BILATERAL TURBINATE REDUCTION;  Surgeon: Flo Shanks, MD;  Location: [facility] OR;  Service: ENT;  Laterality: Bilateral;  . TOOTH EXTRACTION      OB History    Gravida  1   Para      Term      Preterm      AB      Living  1     SAB      TAB      Ectopic      Multiple      Live Births               Home Medications    Prior to Admission medications   Medication Sig Start Date End Date Taking? Authorizing Provider  cetirizine (ZYRTEC) 10 MG tablet Take 1 tablet  (10 mg total) by mouth 2 (two) times daily. 02/28/18   Eustace Moore, MD  ipratropium (ATROVENT) 0.03 % nasal spray Place 2 sprays into both nostrils every 12 (twelve) hours.    [provider]  methylPREDNISolone (MEDROL DOSEPAK) 4 MG TBPK tablet tad 02/28/18   Eustace Moore, MD  phentermine (ADIPEX-P) 37.5 MG tablet Take 37.5 mg by mouth daily. 06/28/17   [provider]    Family History Family History  Problem Relation Age of Onset  . Diabetes Other   . Heart disease Other   . Hyperlipidemia Other   . Hypertension Other   . Stroke Other   . Thyroid disease Other   . Diabetes Maternal Aunt   . Diabetes Paternal Aunt     Social History Social History   Tobacco Use  . Smoking status: Never Smoker  . Smokeless tobacco: Never Used  Substance Use Topics  . Alcohol use: Yes    Comment: occasional  . Drug use: No     Allergies   Patient has no known  allergies.   Review of Systems Review of Systems  Constitutional: Negative for chills and fever.  HENT: Negative for ear pain and sore throat.   Eyes: Negative for pain and visual disturbance.  Respiratory: Negative for cough and shortness of breath.   Cardiovascular: Negative for chest pain and palpitations.  Gastrointestinal: Negative for abdominal pain and vomiting.  Genitourinary: Negative for dysuria and hematuria.  Musculoskeletal: Negative for arthralgias and back pain.  Skin: Positive for rash. Negative for color change.  Neurological: Negative for seizures and syncope.  All other systems reviewed and are negative.    Physical Exam Triage Vital Signs ED Triage Vitals  Enc Vitals Group     BP 02/28/18 1006 (!) 115/55     Pulse Rate 02/28/18 1005 73     Resp 02/28/18 1005 16     Temp 02/28/18 1005 98 F (36.7 C)     Temp src --      SpO2 02/28/18 1005 100 %     Weight --      Height --      Head Circumference --      Peak Flow --      Pain Score 02/28/18 1006 0     Pain Loc --       Pain Edu? --      Excl. in GC? --    No data found.  Updated Vital Signs BP (!) 115/55   Pulse 73   Temp 98 F (36.7 C)   Resp 16   SpO2 100%   Visual Acuity Right Eye Distance:   Left Eye Distance:   Bilateral Distance:    Right Eye Near:   Left Eye Near:    Bilateral Near:     Physical Exam Constitutional:      General: She is not in acute distress.    Appearance: She is well-developed.     Comments: Itchy  HENT:     Head: Normocephalic and atraumatic.     Nose: Congestion present.     Mouth/Throat:     Mouth: Mucous membranes are moist.     Comments: Oropharynx benign uvula midline Eyes:     Conjunctiva/sclera: Conjunctivae normal.     Pupils: Pupils are equal, round, and reactive to light.  Neck:     Musculoskeletal: Normal range of motion.  Cardiovascular:     Rate and Rhythm: Normal rate and regular rhythm.     Heart sounds: Normal heart sounds.  Pulmonary:     Effort: Pulmonary effort is normal. No respiratory distress.     Breath sounds: No stridor. No wheezing.  Abdominal:     General: There is no distension.     Palpations: Abdomen is soft.  Musculoskeletal: Normal range of motion.  Skin:    General: Skin is warm and dry.     Findings: Rash present.     Comments: Urticarial wheals are present on both arms, chest and back, mostly oval to circular, 1 to 3 cm across.  Neurological:     Mental Status: She is alert.  Psychiatric:        Mood and Affect: Mood normal.        Thought Content: Thought content normal.      UC Treatments / Results  Labs (all labs ordered are listed, but only abnormal results are displayed) Labs Reviewed - No data to display  EKG None  Radiology No results found.  Procedures Procedures (including critical care time)  Medications Ordered in UC  Medications - No data to display  Initial Impression / Assessment and Plan / UC Course  I have reviewed the triage vital signs and the nursing notes.  Pertinent  labs & imaging results that were available during my care of the patient were reviewed by me and considered in my medical decision making (see chart for details).    Discussed treatment.  Treatment should include antihistamine.  She should use Zyrtec as well as Benadryl.  If this does not work she can come in for prednisone.  Or see her PCP.  Final Clinical Impressions(s) / UC Diagnoses   Final diagnoses:  Urticaria     Discharge Instructions     Take the prednisone as directed Take all of day one today Take zyrtec 10 mg 2 times a day for the itching Take benadryl at night if needed If you have recurrent hives, need to see an allergy specialist, your PCP can refer   ED Prescriptions    Medication Sig Dispense Auth. Provider   methylPREDNISolone (MEDROL DOSEPAK) 4 MG TBPK tablet tad 21 tablet Eustace MooreNelson, Alanah Sakuma Sue, MD   cetirizine (ZYRTEC) 10 MG tablet Take 1 tablet (10 mg total) by mouth 2 (two) times daily. 30 tablet Eustace MooreNelson, Caelyn Route Sue, MD     Controlled Substance Prescriptions [place] Controlled Substance Registry consulted? Not Applicable   Eustace MooreNelson, Clora Ohmer Sue, MD 02/28/18 2126

## 2018-02-28 NOTE — Discharge Instructions (Signed)
Take the prednisone as directed Take all of day one today Take zyrtec 10 mg 2 times a day for the itching Take benadryl at night if needed If you have recurrent hives, need to see an allergy specialist, your PCP can refer

## 2018-02-28 NOTE — ED Triage Notes (Signed)
Pt c/o itchiness, rash, hives on her arms, legs, ear, x1 week.

## 2018-08-03 ENCOUNTER — Other Ambulatory Visit: Payer: Self-pay

## 2018-08-03 ENCOUNTER — Ambulatory Visit (INDEPENDENT_AMBULATORY_CARE_PROVIDER_SITE_OTHER): Payer: Managed Care, Other (non HMO) | Admitting: Obstetrics

## 2018-08-03 ENCOUNTER — Encounter: Payer: Self-pay | Admitting: Obstetrics

## 2018-08-03 VITALS — BP 128/84 | HR 72 | Ht 60.0 in | Wt 246.0 lb

## 2018-08-03 DIAGNOSIS — B373 Candidiasis of vulva and vagina: Secondary | ICD-10-CM | POA: Diagnosis not present

## 2018-08-03 DIAGNOSIS — Z113 Encounter for screening for infections with a predominantly sexual mode of transmission: Secondary | ICD-10-CM

## 2018-08-03 DIAGNOSIS — J301 Allergic rhinitis due to pollen: Secondary | ICD-10-CM

## 2018-08-03 DIAGNOSIS — Z3046 Encounter for surveillance of implantable subdermal contraceptive: Secondary | ICD-10-CM

## 2018-08-03 DIAGNOSIS — N898 Other specified noninflammatory disorders of vagina: Secondary | ICD-10-CM | POA: Diagnosis not present

## 2018-08-03 DIAGNOSIS — Z1151 Encounter for screening for human papillomavirus (HPV): Secondary | ICD-10-CM | POA: Diagnosis not present

## 2018-08-03 DIAGNOSIS — B9689 Other specified bacterial agents as the cause of diseases classified elsewhere: Secondary | ICD-10-CM | POA: Diagnosis not present

## 2018-08-03 DIAGNOSIS — E66813 Obesity, class 3: Secondary | ICD-10-CM

## 2018-08-03 DIAGNOSIS — Z01419 Encounter for gynecological examination (general) (routine) without abnormal findings: Secondary | ICD-10-CM | POA: Diagnosis not present

## 2018-08-03 DIAGNOSIS — R52 Pain, unspecified: Secondary | ICD-10-CM

## 2018-08-03 DIAGNOSIS — Z124 Encounter for screening for malignant neoplasm of cervix: Secondary | ICD-10-CM | POA: Diagnosis not present

## 2018-08-03 DIAGNOSIS — N760 Acute vaginitis: Secondary | ICD-10-CM | POA: Diagnosis not present

## 2018-08-03 MED ORDER — NAPROXEN 500 MG PO TBEC: 500.0000 mg | DELAYED_RELEASE_TABLET | Freq: Two times a day (BID) | ORAL | 4 refills | Status: DC

## 2018-08-03 MED ORDER — LORATADINE 10 MG PO TABS: 10.0000 mg | ORAL_TABLET | Freq: Every day | ORAL | 11 refills | Status: DC

## 2018-08-03 NOTE — Progress Notes (Signed)
Subjective:        Jenna Blair is a 33 y.o. female here for a routine exam.  Current complaints: NONE.    Personal health questionnaire:  Is patient [name], have a family history of breast and/or ovarian cancer: no Is there a family history of uterine cancer diagnosed at age < 71, gastrointestinal cancer, urinary tract cancer, family member who is a Field seismologist syndrome-associated carrier: no Is the patient overweight and hypertensive, family history of diabetes, personal history of gestational diabetes, preeclampsia or PCOS: no Is patient over 56, have PCOS,  family history of premature CHD under age 79, diabetes, smoke, have hypertension or peripheral artery disease:  no At any time, has a partner hit, kicked or otherwise hurt or frightened you?: no Over the past 2 weeks, have you felt down, depressed or hopeless?: no Over the past 2 weeks, have you felt little interest or pleasure in doing things?:no   Gynecologic History No LMP recorded. Patient has had an implant. Contraception: tubal ligation Last Pap: 07-08-2017. Results were: normal Last mammogram: n/a. Results were: n/a  Obstetric History OB History  Gravida Para Term Preterm AB Living  1         1  SAB TAB Ectopic Multiple Live Births               # Outcome Date GA Lbr Len/2nd Weight Sex Delivery Anes PTL Lv  1 Gravida             Past Medical History:  Diagnosis Date  . Depression   . Environmental allergies   . Sleep apnea    Uses C Pap nightly    Past Surgical History:  Procedure Laterality Date  . CESAREAN SECTION     x1  . LEG SURGERY Right    Injury as a child  . TONSILLECTOMY/ADENOIDECTOMY/TURBINATE REDUCTION Bilateral 04/20/2017   Procedure: TONSILLECTOMY/ADENOIDECTOMY AND BILATERAL TURBINATE REDUCTION;  Surgeon: Jenna Marble, MD;  Location: [place];  Service: ENT;  Laterality: Bilateral;  . TOOTH EXTRACTION       Current Outpatient Medications:  .  ipratropium (ATROVENT) 0.03 % nasal  spray, Place 2 sprays into both nostrils every 12 (twelve) hours., Disp: , Rfl:  .  methylPREDNISolone (MEDROL DOSEPAK) 4 MG TBPK tablet, tad, Disp: 21 tablet, Rfl: 0 .  cetirizine (ZYRTEC) 10 MG tablet, Take 1 tablet (10 mg total) by mouth 2 (two) times daily. (Patient not taking: Reported on 08/03/2018), Disp: 30 tablet, Rfl: 0 .  loratadine (CLARITIN) 10 MG tablet, Take 1 tablet (10 mg total) by mouth daily., Disp: 30 tablet, Rfl: 11 .  naproxen (EC-NAPROSYN) 500 MG EC tablet, Take 1 tablet (500 mg total) by mouth 2 (two) times daily with a meal., Disp: 30 tablet, Rfl: 4 .  phentermine (ADIPEX-P) 37.5 MG tablet, Take 37.5 mg by mouth daily., Disp: , Rfl: 0 No Known Allergies  Social History   Tobacco Use  . Smoking status: Never Smoker  . Smokeless tobacco: Never Used  Substance Use Topics  . Alcohol use: Yes    Comment: occasional    Family History  Problem Relation Age of Onset  . Diabetes Other   . Heart disease Other   . Hyperlipidemia Other   . Hypertension Other   . Stroke Other   . Thyroid disease Other   . Diabetes Maternal Aunt   . Diabetes Paternal Aunt       Review of Systems  Constitutional: negative for fatigue and weight loss  Respiratory: negative for cough and wheezing Cardiovascular: negative for chest pain, fatigue and palpitations Gastrointestinal: negative for abdominal pain and change in bowel habits Musculoskeletal:negative for myalgias Neurological: negative for gait problems and tremors Behavioral/Psych: negative for abusive relationship, depression Endocrine: negative for temperature intolerance    Genitourinary:negative for abnormal menstrual periods, genital lesions, hot flashes, sexual problems and vaginal discharge Integument/breast: negative for breast lump, breast tenderness, nipple discharge and skin lesion(s)    Objective:       BP 128/84   Pulse 72   Ht 5' (1.524 m)   Wt 246 lb (111.6 kg)   BMI 48.04 kg/m  General:   alert  Skin:    no rash or abnormalities  Lungs:   clear to auscultation bilaterally  Heart:   regular rate and rhythm, S1, S2 normal, no murmur, click, rub or gallop  Breasts:   normal without suspicious masses, skin or nipple changes or axillary nodes  Abdomen:  normal findings: no organomegaly, soft, non-tender and no hernia  Pelvis:  External genitalia: normal general appearance Urinary system: urethral meatus normal and bladder without fullness, nontender Vaginal: normal without tenderness, induration or masses Cervix: normal appearance Adnexa: normal bimanual exam Uterus: anteverted and non-tender, normal size   Lab Review Urine pregnancy test Labs reviewed yes Radiologic studies reviewed no  50% of 25 min visit spent on counseling and coordination of care.   Assessment and Plan:     1. Encounter for gynecological examination with Papanicolaou smear of cervix Rx: - Cytology - PAP( [name])  2. Vaginal discharge Rx: - Cervicovaginal ancillary only( [name])  3. Class 3 severe obesity due to excess calories without serious comorbidity with body mass index (BMI) of 45.0 to 49.9 in adult [name] - program of caloric reduction, exercise and behavioral modification recommended  4. Encounter for surveillance of implantable subdermal contraceptive - doing well  5. Pain Rx: - naproxen (EC-NAPROSYN) 500 MG EC tablet; Take 1 tablet (500 mg total) by mouth 2 (two) times daily with a meal.  Dispense: 30 tablet; Refill: 4  6. Seasonal allergic rhinitis due to pollen Rx: - loratadine (CLARITIN) 10 MG tablet; Take 1 tablet (10 mg total) by mouth daily.  Dispense: 30 tablet; Refill: 11    Plan:    Education reviewed: calcium supplements, depression evaluation, low fat, low cholesterol diet, safe sex/STD prevention, self breast exams and weight bearing exercise. Follow up in: 1 year.   Meds ordered this encounter  Medications  . naproxen (EC-NAPROSYN) 500 MG EC tablet    Sig: Take 1  tablet (500 mg total) by mouth 2 (two) times daily with a meal.    Dispense:  30 tablet    Refill:  4  . loratadine (CLARITIN) 10 MG tablet    Sig: Take 1 tablet (10 mg total) by mouth daily.    Dispense:  30 tablet    Refill:  11   No orders of the defined types were placed in this encounter.   Brock BadHARLES A. Stashia Sia MD 08-03-2018

## 2018-08-04 LAB — CERVICOVAGINAL ANCILLARY ONLY
Bacterial vaginitis: POSITIVE — AB
Candida vaginitis: POSITIVE — AB
Chlamydia: NEGATIVE
Neisseria Gonorrhea: NEGATIVE
Trichomonas: NEGATIVE

## 2018-08-07 ENCOUNTER — Other Ambulatory Visit: Payer: Self-pay | Admitting: Obstetrics

## 2018-08-07 DIAGNOSIS — B9689 Other specified bacterial agents as the cause of diseases classified elsewhere: Secondary | ICD-10-CM

## 2018-08-07 DIAGNOSIS — B3731 Acute candidiasis of vulva and vagina: Secondary | ICD-10-CM

## 2018-08-07 DIAGNOSIS — B373 Candidiasis of vulva and vagina: Secondary | ICD-10-CM

## 2018-08-07 MED ORDER — TERCONAZOLE 0.4 % VA CREA: 1.0000 | TOPICAL_CREAM | Freq: Every day | VAGINAL | 0 refills | Status: DC

## 2018-08-07 MED ORDER — TINIDAZOLE 500 MG PO TABS: 1000.0000 mg | ORAL_TABLET | Freq: Every day | ORAL | 2 refills | Status: DC

## 2018-08-08 LAB — CYTOLOGY - PAP
Adequacy: ABSENT
Diagnosis: NEGATIVE
HPV: NOT DETECTED

## 2018-08-09 ENCOUNTER — Other Ambulatory Visit: Payer: Self-pay | Admitting: Obstetrics

## 2018-09-25 ENCOUNTER — Encounter

## 2019-03-15 DIAGNOSIS — Z20828 Contact with and (suspected) exposure to other viral communicable diseases: Secondary | ICD-10-CM | POA: Diagnosis not present

## 2019-03-19 ENCOUNTER — Ambulatory Visit: Payer: BC Managed Care – PPO | Attending: Internal Medicine

## 2019-03-19 ENCOUNTER — Other Ambulatory Visit: Payer: Self-pay

## 2019-03-19 DIAGNOSIS — Z20822 Contact with and (suspected) exposure to covid-19: Secondary | ICD-10-CM

## 2019-03-20 ENCOUNTER — Telehealth (HOSPITAL_COMMUNITY): Payer: Self-pay

## 2019-03-20 LAB — NOVEL CORONAVIRUS, NAA: SARS-CoV-2, NAA: NOT DETECTED

## 2019-03-30 ENCOUNTER — Ambulatory Visit: Payer: BC Managed Care – PPO | Attending: Internal Medicine

## 2019-03-30 DIAGNOSIS — Z20822 Contact with and (suspected) exposure to covid-19: Secondary | ICD-10-CM

## 2019-03-31 LAB — NOVEL CORONAVIRUS, NAA: SARS-CoV-2, NAA: NOT DETECTED

## 2019-05-29 ENCOUNTER — Encounter: Payer: Self-pay | Admitting: Obstetrics

## 2019-05-29 ENCOUNTER — Other Ambulatory Visit: Payer: Self-pay

## 2019-05-29 ENCOUNTER — Ambulatory Visit: Payer: BC Managed Care – PPO | Admitting: Obstetrics

## 2019-05-29 ENCOUNTER — Other Ambulatory Visit (HOSPITAL_COMMUNITY)
Admission: RE | Admit: 2019-05-29 | Discharge: 2019-05-29 | Disposition: A | Discharge status: Home / Self Care | Payer: BC Managed Care – PPO | Source: Ambulatory Visit | Attending: Obstetrics | Admitting: Obstetrics

## 2019-05-29 VITALS — BP 117/78 | HR 81 | Wt 258.0 lb

## 2019-05-29 DIAGNOSIS — N760 Acute vaginitis: Secondary | ICD-10-CM | POA: Insufficient documentation

## 2019-05-29 DIAGNOSIS — Z3046 Encounter for surveillance of implantable subdermal contraceptive: Secondary | ICD-10-CM | POA: Diagnosis not present

## 2019-05-29 DIAGNOSIS — B9689 Other specified bacterial agents as the cause of diseases classified elsewhere: Secondary | ICD-10-CM | POA: Diagnosis not present

## 2019-05-29 DIAGNOSIS — Z113 Encounter for screening for infections with a predominantly sexual mode of transmission: Secondary | ICD-10-CM | POA: Diagnosis not present

## 2019-05-29 NOTE — Progress Notes (Signed)
Nexplanon Procedure Note    PROCEDURE: Nexplanon removal and placement Performing Provider:  Brock Bad, MD  Patient education prior to procedure, explained risk, benefits of Nexplanon, reviewed alternative options. Patient reported understanding. Gave consent to continue with procedure.  Patient had NEXPLANON inserted in April 2018. Desires removal today w/ reinsertion  PROCEDURE:  Pregnancy Text :  not indicated Site (check):      left arm         Sterile Preparation:   Betadinex3 Lot # [id number] Expiration Date 2022 NOV 10    The patient's left arm was palpated and the implant device located. The area was prepped with Betadinex3. The distal end of the device was palpated and 1.5 cc of 1% lidocaine without epinephrine was injected. A 1.5 mm incision was made. Any fibrotic tissue was carefully dissected away using blunt and/or sharp dissection. The device was removed in an intact manner.   Insertion site was the same as the removal site. Nexplanon  was inserted subcutaneously.Needle was removed from the insertion site. Nexplanon capsule was palpated by provider and patient to assure satisfactory placement. Dressing applied.  Followup: The patient tolerated the procedure well without complications.  Standard post-procedure care is explained and return precautions are given.  Brock Bad, MD 05/29/2019 11:23 AM

## 2019-05-30 ENCOUNTER — Other Ambulatory Visit: Payer: Self-pay | Admitting: Obstetrics

## 2019-05-30 DIAGNOSIS — B9689 Other specified bacterial agents as the cause of diseases classified elsewhere: Secondary | ICD-10-CM

## 2019-05-30 DIAGNOSIS — N760 Acute vaginitis: Secondary | ICD-10-CM

## 2019-05-30 LAB — CERVICOVAGINAL ANCILLARY ONLY
Bacterial Vaginitis (gardnerella): POSITIVE — AB
Candida Glabrata: NEGATIVE
Candida Vaginitis: NEGATIVE
Chlamydia: NEGATIVE
Comment: NEGATIVE
Comment: NEGATIVE
Comment: NEGATIVE
Comment: NEGATIVE
Comment: NEGATIVE
Comment: NORMAL
Neisseria Gonorrhea: NEGATIVE
Trichomonas: NEGATIVE

## 2019-05-30 MED ORDER — TINIDAZOLE 500 MG PO TABS: 1000.0000 mg | ORAL_TABLET | Freq: Every day | ORAL | 2 refills | Status: DC

## 2019-06-12 ENCOUNTER — Ambulatory Visit (INDEPENDENT_AMBULATORY_CARE_PROVIDER_SITE_OTHER): Payer: BC Managed Care – PPO | Admitting: Obstetrics

## 2019-06-12 ENCOUNTER — Encounter: Payer: Self-pay | Admitting: Obstetrics

## 2019-06-12 ENCOUNTER — Other Ambulatory Visit: Payer: Self-pay

## 2019-06-12 VITALS — BP 121/80 | HR 67 | Wt 256.0 lb

## 2019-06-12 DIAGNOSIS — Z113 Encounter for screening for infections with a predominantly sexual mode of transmission: Secondary | ICD-10-CM | POA: Diagnosis not present

## 2019-06-12 DIAGNOSIS — Z3046 Encounter for surveillance of implantable subdermal contraceptive: Secondary | ICD-10-CM | POA: Diagnosis not present

## 2019-06-12 NOTE — Progress Notes (Signed)
Subjective:    Jenna Blair is a 34 y.o. female who presents for follow up after Nexplanon removal / reinsertion . The patient has no complaints today. The patient is sexually active. Pertinent past medical history: none.  The information documented in the HPI was reviewed and verified.  Menstrual History: OB History    Gravida  1   Para      Term      Preterm      AB      Living  1     SAB      TAB      Ectopic      Multiple      Live Births               No LMP recorded. Patient has had an implant.   Patient Active Problem List   Diagnosis Date Noted  . Sleep apnea 04/20/2017  . Bleeding internal hemorrhoids 01/07/2017  . Morbid obesity ([place]) 12/03/2014  . OSA (obstructive sleep apnea) 10/22/2014   Past Medical History:  Diagnosis Date  . Depression   . Environmental allergies   . Sleep apnea    Uses C Pap nightly    Past Surgical History:  Procedure Laterality Date  . CESAREAN SECTION     x1  . LEG SURGERY Right    Injury as a child  . TONSILLECTOMY/ADENOIDECTOMY/TURBINATE REDUCTION Bilateral 04/20/2017   Procedure: TONSILLECTOMY/ADENOIDECTOMY AND BILATERAL TURBINATE REDUCTION;  Surgeon: Jodi Marble, MD;  Location: [place];  Service: ENT;  Laterality: Bilateral;  . TOOTH EXTRACTION       Current Outpatient Medications:  .  cetirizine (ZYRTEC) 10 MG tablet, Take 1 tablet (10 mg total) by mouth 2 (two) times daily. (Patient not taking: Reported on 08/03/2018), Disp: 30 tablet, Rfl: 0 .  ipratropium (ATROVENT) 0.03 % nasal spray, Place 2 sprays into both nostrils every 12 (twelve) hours., Disp: , Rfl:  .  loratadine (CLARITIN) 10 MG tablet, Take 1 tablet (10 mg total) by mouth daily., Disp: 30 tablet, Rfl: 11 .  methylPREDNISolone (MEDROL DOSEPAK) 4 MG TBPK tablet, tad, Disp: 21 tablet, Rfl: 0 .  naproxen (EC-NAPROSYN) 500 MG EC tablet, Take 1 tablet (500 mg total) by mouth 2 (two) times daily with a meal., Disp: 30 tablet, Rfl: 4 .  phentermine  (ADIPEX-P) 37.5 MG tablet, Take 37.5 mg by mouth daily., Disp: , Rfl: 0 .  tinidazole (TINDAMAX) 500 MG tablet, Take 2 tablets (1,000 mg total) by mouth daily with breakfast., Disp: 10 tablet, Rfl: 2 No Known Allergies  Social History   Tobacco Use  . Smoking status: Never Smoker  . Smokeless tobacco: Never Used  Substance Use Topics  . Alcohol use: Yes    Comment: occasional    Family History  Problem Relation Age of Onset  . Diabetes Other   . Heart disease Other   . Hyperlipidemia Other   . Hypertension Other   . Stroke Other   . Thyroid disease Other   . Diabetes Maternal Aunt   . Diabetes Paternal Aunt        Review of Systems Constitutional: negative for weight loss Genitourinary:negative for abnormal menstrual periods and vaginal discharge   Objective:   BP 121/80   Pulse 67   Wt 256 lb (116.1 kg)   BMI 50.00 kg/m    General:   alert  Skin:   no rash or abnormalities  Lungs:   clear to auscultation bilaterally  Heart:   regular  rate and rhythm, S1, S2 normal, no murmur, click, rub or gallop  Breasts:   normal without suspicious masses, skin or nipple changes or axillary nodes  Abdomen:  normal findings: no organomegaly, soft, non-tender and no hernia  Pelvis:  External genitalia: normal general appearance Urinary system: urethral meatus normal and bladder without fullness, nontender Vaginal: normal without tenderness, induration or masses Cervix: normal appearance Adnexa: normal bimanual exam Uterus: anteverted and non-tender, normal size   Lab Review Urine pregnancy test Labs reviewed yes Radiologic studies reviewed no  50% of 15 min visit spent on counseling and coordination of care.    Assessment:    34 y.o., continuing Nexplanon, no contraindications.   Plan:    All questions answered. Contraception: Nexplanon. Discussed healthy lifestyle modifications. Follow up in 3 months.  Orders Placed This Encounter  Procedures  . HIV Antibody  (routine testing w rflx)  . Hepatitis C Antibody  . RPR  . Hepatitis B surface antigen    Brock Bad, MD 06/12/2019 9:34 AM

## 2019-06-13 LAB — HIV ANTIBODY (ROUTINE TESTING W REFLEX): HIV Screen 4th Generation wRfx: NONREACTIVE

## 2019-06-13 LAB — RPR: RPR Ser Ql: NONREACTIVE

## 2019-06-13 LAB — HEPATITIS C ANTIBODY: Hep C Virus Ab: <0.1 s/co ratio (ref 0.0–0.9)

## 2019-06-13 LAB — HEPATITIS B SURFACE ANTIGEN: Hepatitis B Surface Ag: NEGATIVE

## 2019-07-05 DIAGNOSIS — F4312 Post-traumatic stress disorder, chronic: Secondary | ICD-10-CM | POA: Diagnosis not present

## 2019-07-21 DIAGNOSIS — F4312 Post-traumatic stress disorder, chronic: Secondary | ICD-10-CM | POA: Diagnosis not present

## 2019-08-04 DIAGNOSIS — F4312 Post-traumatic stress disorder, chronic: Secondary | ICD-10-CM | POA: Diagnosis not present

## 2019-08-06 ENCOUNTER — Other Ambulatory Visit (HOSPITAL_COMMUNITY)
Admission: RE | Admit: 2019-08-06 | Discharge: 2019-08-06 | Disposition: A | Discharge status: Home / Self Care | Payer: BC Managed Care – PPO | Source: Ambulatory Visit | Attending: Obstetrics | Admitting: Obstetrics

## 2019-08-06 ENCOUNTER — Encounter: Payer: Self-pay | Admitting: Obstetrics

## 2019-08-06 ENCOUNTER — Ambulatory Visit (INDEPENDENT_AMBULATORY_CARE_PROVIDER_SITE_OTHER): Payer: BC Managed Care – PPO | Admitting: Obstetrics

## 2019-08-06 ENCOUNTER — Other Ambulatory Visit: Payer: Self-pay

## 2019-08-06 VITALS — BP 112/79 | HR 64 | Ht 60.0 in | Wt 258.0 lb

## 2019-08-06 DIAGNOSIS — N898 Other specified noninflammatory disorders of vagina: Secondary | ICD-10-CM | POA: Insufficient documentation

## 2019-08-06 DIAGNOSIS — B9689 Other specified bacterial agents as the cause of diseases classified elsewhere: Secondary | ICD-10-CM

## 2019-08-06 DIAGNOSIS — Z01419 Encounter for gynecological examination (general) (routine) without abnormal findings: Secondary | ICD-10-CM | POA: Insufficient documentation

## 2019-08-06 DIAGNOSIS — N760 Acute vaginitis: Secondary | ICD-10-CM

## 2019-08-06 DIAGNOSIS — Z6841 Body Mass Index (BMI) 40.0 and over, adult: Secondary | ICD-10-CM

## 2019-08-06 MED ORDER — TINIDAZOLE 500 MG PO TABS: 1000.0000 mg | ORAL_TABLET | Freq: Every day | ORAL | 2 refills | Status: DC

## 2019-08-06 NOTE — Progress Notes (Signed)
Pt is in the office for annual. Last pap 08-03-18 Pt has nexplanon for [facility] Pt complains of recurrent bv. GAD-7= 10

## 2019-08-06 NOTE — Progress Notes (Signed)
Subjective:        Jenna Blair is a 34 y.o. female here for a routine exam.  Current complaints: Malodorous vaginal discharge.    Personal health questionnaire:  Is patient [name], have a family history of breast and/or ovarian cancer: no Is there a family history of uterine cancer diagnosed at age < 79, gastrointestinal cancer, urinary tract cancer, family member who is a Personnel officer syndrome-associated carrier: no Is the patient overweight and hypertensive, family history of diabetes, personal history of gestational diabetes, preeclampsia or PCOS: yes Is patient over 2, have PCOS,  family history of premature CHD under age 32, diabetes, smoke, have hypertension or peripheral artery disease:  no At any time, has a partner hit, kicked or otherwise hurt or frightened you?: no Over the past 2 weeks, have you felt down, depressed or hopeless?: no Over the past 2 weeks, have you felt little interest or pleasure in doing things?:no   Gynecologic History No LMP recorded. Patient has had an implant. Contraception: Nexplanon Last Pap: 06-12-2018. Results were: normal Last mammogram: n/a. Results were: n/a  Obstetric History OB History  Gravida Para Term Preterm AB Living  1         1  SAB TAB Ectopic Multiple Live Births          1    # Outcome Date GA Lbr Len/2nd Weight Sex Delivery Anes PTL Lv  1 Gravida 02/20/07     CS-LTranv   LIV    Past Medical History:  Diagnosis Date  . Depression   . Environmental allergies   . Sleep apnea    Uses C Pap nightly    Past Surgical History:  Procedure Laterality Date  . CESAREAN SECTION     x1  . LEG SURGERY Right    Injury as a child  . TONSILLECTOMY/ADENOIDECTOMY/TURBINATE REDUCTION Bilateral 04/20/2017   Procedure: TONSILLECTOMY/ADENOIDECTOMY AND BILATERAL TURBINATE REDUCTION;  Surgeon: Flo Shanks, MD;  Location: [facility] OR;  Service: ENT;  Laterality: Bilateral;  . TOOTH EXTRACTION       Current Outpatient Medications:   .  escitalopram (LEXAPRO) 5 MG tablet, Take 5 mg by mouth daily., Disp: , Rfl:  .  cetirizine (ZYRTEC) 10 MG tablet, Take 1 tablet (10 mg total) by mouth 2 (two) times daily. (Patient not taking: Reported on 08/03/2018), Disp: 30 tablet, Rfl: 0 .  ipratropium (ATROVENT) 0.03 % nasal spray, Place 2 sprays into both nostrils every 12 (twelve) hours. (Patient not taking: Reported on 08/06/2019), Disp: , Rfl:  .  loratadine (CLARITIN) 10 MG tablet, Take 1 tablet (10 mg total) by mouth daily. (Patient not taking: Reported on 08/06/2019), Disp: 30 tablet, Rfl: 11 .  methylPREDNISolone (MEDROL DOSEPAK) 4 MG TBPK tablet, tad (Patient not taking: Reported on 08/06/2019), Disp: 21 tablet, Rfl: 0 .  naproxen (EC-NAPROSYN) 500 MG EC tablet, Take 1 tablet (500 mg total) by mouth 2 (two) times daily with a meal. (Patient not taking: Reported on 08/06/2019), Disp: 30 tablet, Rfl: 4 .  phentermine (ADIPEX-P) 37.5 MG tablet, Take 37.5 mg by mouth daily. (Patient not taking: Reported on 08/06/2019), Disp: , Rfl: 0 .  tinidazole (TINDAMAX) 500 MG tablet, Take 2 tablets (1,000 mg total) by mouth daily with breakfast., Disp: 10 tablet, Rfl: 2 No Known Allergies  Social History   Tobacco Use  . Smoking status: Never Smoker  . Smokeless tobacco: Never Used  Substance Use Topics  . Alcohol use: Yes    Comment: occasional  Family History  Problem Relation Age of Onset  . Diabetes Other   . Heart disease Other   . Hyperlipidemia Other   . Hypertension Other   . Stroke Other   . Thyroid disease Other   . Diabetes Maternal Aunt   . Diabetes Paternal Aunt       Review of Systems  Constitutional: negative for fatigue and weight loss Respiratory: negative for cough and wheezing Cardiovascular: negative for chest pain, fatigue and palpitations Gastrointestinal: negative for abdominal pain and change in bowel habits Musculoskeletal:negative for myalgias Neurological: negative for gait problems and  tremors Behavioral/Psych: negative for abusive relationship, depression Endocrine: negative for temperature intolerance    Genitourinary:negative for abnormal menstrual periods, genital lesions, hot flashes, sexual problems.  Positive for malodorous vaginal discharge Integument/breast: negative for breast lump, breast tenderness, nipple discharge and skin lesion(s)    Objective:       BP 112/79   Pulse 64   Ht 5' (1.524 m)   Wt 258 lb (117 kg)   BMI 50.39 kg/m  General:   alert and no distress  Skin:   no rash or abnormalities  Lungs:   clear to auscultation bilaterally  Heart:   regular rate and rhythm, S1, S2 normal, no murmur, click, rub or gallop  Breasts:   normal without suspicious masses, skin or nipple changes or axillary nodes  Abdomen:  normal findings: no organomegaly, soft, non-tender and no hernia  Pelvis:  External genitalia: normal general appearance Urinary system: urethral meatus normal and bladder without fullness, nontender Vaginal: normal without tenderness, induration or masses Cervix: normal appearance Adnexa: normal bimanual exam Uterus: anteverted and non-tender, normal size   Lab Review Urine pregnancy test Labs reviewed yes Radiologic studies reviewed no  50% of 20 min visit spent on counseling and coordination of care.   Assessment:     1. Encounter for routine gynecological examination with Papanicolaou smear of cervix Rx: - Cytology - PAP( [place])  2. Vaginal discharge Rx: - Cervicovaginal ancillary only( )  3. Class 3 severe obesity due to excess calories without serious comorbidity with body mass index (BMI) of 45.0 to 49.9 in adult [facility]) - program of caloric reduction, exercise and behavioral modification recommended  4. BV (bacterial vaginosis) Rx: - tinidazole (TINDAMAX) 500 MG tablet; Take 2 tablets (1,000 mg total) by mouth daily with breakfast.  Dispense: 10 tablet; Refill: 2    Plan:    Education reviewed:  calcium supplements, depression evaluation, low fat, low cholesterol diet, safe sex/STD prevention, self breast exams and weight bearing exercise. Contraception: Nexplanon. Follow up in: 1 year.   Meds ordered this encounter  Medications  . tinidazole (TINDAMAX) 500 MG tablet    Sig: Take 2 tablets (1,000 mg total) by mouth daily with breakfast.    Dispense:  10 tablet    Refill:  2    Brock Bad, MD 08/06/2019 9:28 AM

## 2019-08-07 LAB — CERVICOVAGINAL ANCILLARY ONLY
Bacterial Vaginitis (gardnerella): NEGATIVE
Candida Glabrata: NEGATIVE
Candida Vaginitis: NEGATIVE
Chlamydia: NEGATIVE
Comment: NEGATIVE
Comment: NEGATIVE
Comment: NEGATIVE
Comment: NEGATIVE
Comment: NEGATIVE
Comment: NORMAL
Neisseria Gonorrhea: NEGATIVE
Trichomonas: NEGATIVE

## 2019-08-08 LAB — CYTOLOGY - PAP
Comment: NEGATIVE
Diagnosis: NEGATIVE
High risk HPV: NEGATIVE

## 2019-08-18 DIAGNOSIS — F4312 Post-traumatic stress disorder, chronic: Secondary | ICD-10-CM | POA: Diagnosis not present

## 2019-08-21 DIAGNOSIS — F331 Major depressive disorder, recurrent, moderate: Secondary | ICD-10-CM | POA: Diagnosis not present

## 2019-08-21 DIAGNOSIS — Z79899 Other long term (current) drug therapy: Secondary | ICD-10-CM | POA: Diagnosis not present

## 2019-09-15 DIAGNOSIS — F4312 Post-traumatic stress disorder, chronic: Secondary | ICD-10-CM | POA: Diagnosis not present

## 2019-09-29 DIAGNOSIS — F4312 Post-traumatic stress disorder, chronic: Secondary | ICD-10-CM | POA: Diagnosis not present

## 2019-10-20 DIAGNOSIS — F4312 Post-traumatic stress disorder, chronic: Secondary | ICD-10-CM | POA: Diagnosis not present

## 2019-11-03 DIAGNOSIS — F4312 Post-traumatic stress disorder, chronic: Secondary | ICD-10-CM | POA: Diagnosis not present

## 2019-11-30 DIAGNOSIS — F4312 Post-traumatic stress disorder, chronic: Secondary | ICD-10-CM | POA: Diagnosis not present

## 2019-12-08 ENCOUNTER — Ambulatory Visit (INDEPENDENT_AMBULATORY_CARE_PROVIDER_SITE_OTHER): Payer: BC Managed Care – PPO

## 2019-12-08 ENCOUNTER — Other Ambulatory Visit: Payer: Self-pay

## 2019-12-08 DIAGNOSIS — Z23 Encounter for immunization: Secondary | ICD-10-CM | POA: Diagnosis not present

## 2019-12-23 ENCOUNTER — Encounter (HOSPITAL_COMMUNITY): Payer: Self-pay

## 2019-12-23 ENCOUNTER — Ambulatory Visit (HOSPITAL_COMMUNITY)
Admission: EM | Admit: 2019-12-23 | Discharge: 2019-12-23 | Disposition: A | Discharge status: Home / Self Care | Payer: BC Managed Care – PPO | Attending: Family Medicine | Admitting: Family Medicine

## 2019-12-23 ENCOUNTER — Ambulatory Visit (INDEPENDENT_AMBULATORY_CARE_PROVIDER_SITE_OTHER): Payer: BC Managed Care – PPO

## 2019-12-23 ENCOUNTER — Other Ambulatory Visit: Payer: Self-pay

## 2019-12-23 DIAGNOSIS — R0602 Shortness of breath: Secondary | ICD-10-CM | POA: Diagnosis not present

## 2019-12-23 DIAGNOSIS — Z20822 Contact with and (suspected) exposure to covid-19: Secondary | ICD-10-CM | POA: Insufficient documentation

## 2019-12-23 DIAGNOSIS — R0789 Other chest pain: Secondary | ICD-10-CM | POA: Diagnosis not present

## 2019-12-23 LAB — RESP PANEL BY RT-PCR (FLU A&B, COVID) ARPGX2
Influenza A by PCR: NEGATIVE
Influenza B by PCR: NEGATIVE
SARS Coronavirus 2 by RT PCR: NEGATIVE

## 2019-12-23 MED ORDER — KETOROLAC TROMETHAMINE 60 MG/2ML IM SOLN
60.0000 mg | Freq: Once | INTRAMUSCULAR | Status: AC
  Administered 2019-12-23: 60 mg via INTRAMUSCULAR

## 2019-12-23 MED ORDER — ALBUTEROL SULFATE HFA 108 (90 BASE) MCG/ACT IN AERS
2.0000 | INHALATION_SPRAY | Freq: Once | RESPIRATORY_TRACT | Status: AC
Start: 2019-12-23 — End: 2019-12-23
  Administered 2019-12-23: 2 via RESPIRATORY_TRACT

## 2019-12-23 MED ORDER — ALBUTEROL SULFATE HFA 108 (90 BASE) MCG/ACT IN AERS
INHALATION_SPRAY | RESPIRATORY_TRACT | Status: AC
  Filled 2019-12-23: qty 6.7

## 2019-12-23 MED ORDER — KETOROLAC TROMETHAMINE 60 MG/2ML IM SOLN
INTRAMUSCULAR | Status: AC
  Filled 2019-12-23: qty 2

## 2019-12-23 NOTE — ED Provider Notes (Signed)
[facility]    CSN: [id number] Arrival date & time: 12/23/19  1702      History   Chief Complaint Chief Complaint  Patient presents with  . Chest Pain  . Shortness of Breath  . Dizziness    HPI Jenna Blair is a 34 y.o. female.   HPI  Patient presents today with acute onset shortness of breath, dizziness and chest tightness.  Patient has a history of morbid obesity and sleep apnea with CPAP use. She denies lifting any heavy objects.  She has not had any URI symptoms. She endorses a mild headache. Denies any recurrent history of recurrent chest pain or chronic bronchitis.  Past Medical History:  Diagnosis Date  . Depression   . Environmental allergies   . Sleep apnea    Uses C Pap nightly    Patient Active Problem List   Diagnosis Date Noted  . Sleep apnea 04/20/2017  . Bleeding internal hemorrhoids 01/07/2017  . Morbid obesity (HCC) 12/03/2014  . OSA (obstructive sleep apnea) 10/22/2014    Past Surgical History:  Procedure Laterality Date  . CESAREAN SECTION     x1  . LEG SURGERY Right    Injury as a child  . TONSILLECTOMY/ADENOIDECTOMY/TURBINATE REDUCTION Bilateral 04/20/2017   Procedure: TONSILLECTOMY/ADENOIDECTOMY AND BILATERAL TURBINATE REDUCTION;  Surgeon: Flo Shanks, MD;  Location: [facility] OR;  Service: ENT;  Laterality: Bilateral;  . TOOTH EXTRACTION      OB History    Gravida  1   Para      Term      Preterm      AB      Living  1     SAB      TAB      Ectopic      Multiple      Live Births  1            Home Medications    Prior to Admission medications   Medication Sig Start Date End Date Taking? Authorizing Provider  cetirizine (ZYRTEC) 10 MG tablet Take 1 tablet (10 mg total) by mouth 2 (two) times daily. Patient not taking: Reported on 08/03/2018 02/28/18   Eustace Moore, MD  escitalopram (LEXAPRO) 5 MG tablet Take 5 mg by mouth daily.    [provider]  ipratropium (ATROVENT) 0.03 % nasal  spray Place 2 sprays into both nostrils every 12 (twelve) hours. Patient not taking: Reported on 08/06/2019    [provider]  loratadine (CLARITIN) 10 MG tablet Take 1 tablet (10 mg total) by mouth daily. Patient not taking: Reported on 08/06/2019 08/03/18   Brock Bad, MD  methylPREDNISolone (MEDROL DOSEPAK) 4 MG TBPK tablet tad Patient not taking: Reported on 08/06/2019 02/28/18   Eustace Moore, MD  naproxen (EC-NAPROSYN) 500 MG EC tablet Take 1 tablet (500 mg total) by mouth 2 (two) times daily with a meal. Patient not taking: Reported on 08/06/2019 08/03/18   Brock Bad, MD  phentermine (ADIPEX-P) 37.5 MG tablet Take 37.5 mg by mouth daily. Patient not taking: Reported on 08/06/2019 06/28/17   [provider]  tinidazole (TINDAMAX) 500 MG tablet Take 2 tablets (1,000 mg total) by mouth daily with breakfast. 08/06/19   Brock Bad, MD    Family History Family History  Problem Relation Age of Onset  . Diabetes Other   . Heart disease Other   . Hyperlipidemia Other   . Hypertension Other   . Stroke Other   .  Thyroid disease Other   . Diabetes Maternal Aunt   . Diabetes Paternal Aunt     Social History Social History   Tobacco Use  . Smoking status: Never Smoker  . Smokeless tobacco: Never Used  Vaping Use  . Vaping Use: Never used  Substance Use Topics  . Alcohol use: Yes    Comment: occasional  . Drug use: No     Allergies   Patient has no known allergies.   Review of Systems Review of Systems Pertinent negatives listed in HPI  Physical Exam Triage Vital Signs ED Triage Vitals  Enc Vitals Group     BP 12/23/19 1713 129/65     Pulse Rate 12/23/19 1713 76     Resp 12/23/19 1713 18     Temp 12/23/19 1713 98.9 F (37.2 C)     Temp Source 12/23/19 1713 Oral     SpO2 12/23/19 1713 99 %     Weight --      Height --      Head Circumference --      Peak Flow --      Pain Score 12/23/19 1712 7     Pain Loc --      Pain Edu? --        Excl. in GC? --    No data found.  Updated Vital Signs BP 129/65 (BP Location: Right Arm)   Pulse 76   Temp 98.9 F (37.2 C) (Oral)   Resp 18   SpO2 99%   Visual Acuity Right Eye Distance:   Left Eye Distance:   Bilateral Distance:    Right Eye Near:   Left Eye Near:    Bilateral Near:     Physical Exam Constitutional:      Appearance: She is obese. She is ill-appearing.  Cardiovascular:     Rate and Rhythm: Normal rate and regular rhythm.  Pulmonary:     Effort: Pulmonary effort is normal.     Breath sounds: Decreased breath sounds present. No wheezing or rhonchi.  Musculoskeletal:     Cervical back: Normal range of motion.  Skin:    General: Skin is warm and dry.     Capillary Refill: Capillary refill takes less than 2 seconds.  Neurological:     General: No focal deficit present.     Mental Status: She is alert and oriented to person, place, and time.  Psychiatric:        Mood and Affect: Mood normal.    UC Treatments / Results  Labs (all labs ordered are listed, but only abnormal results are displayed) Labs Reviewed  RESP PANEL BY RT-PCR (FLU A&B, COVID) [id number]    EKG Normal sinus rhythm rate 70 no ST changes  Radiology No results found.  Procedures Procedures (including critical care time)  Medications Ordered in UC Medications - No data to display  Initial Impression / Assessment and Plan / UC Course  I have reviewed the triage vital signs and the nursing notes.  Pertinent labs & imaging results that were available during my care of the patient were reviewed by me and considered in my medical decision making (see chart for details).    Patient presents with acute onset chest tightness and shortness of breath.  Patient given Toradol IM here in clinic and reported improvement of chest tightness.  Patient also administered 2 puffs of an albuterol inhaler and endorsed improvement of shortness of breath.  Patient was able to ambulate without any  distress.  All vital signs were reassuring and pulse ox is 99%.  Patient given strict instructions to follow-up at the ER if any of her symptoms worsen or do not improve.  Patient verbalized understanding and agreement with plan.   Final Clinical Impressions(s) / UC Diagnoses   Final diagnoses:  Atypical chest pain     Discharge Instructions     If symptoms worsen go immediately to the emergency department for further.  For shortness of breath I do recommend using the albuterol inhaler every 4-6 hours 2 puffs as needed.  You may take over-the-counter naproxen or ibuprofen as needed for chest wall pain.    ED Prescriptions    None     PDMP not reviewed this encounter.   Bing Neighbors, FNP 12/23/19 [phone]

## 2019-12-23 NOTE — ED Triage Notes (Signed)
Pt present chest pain with SOB and dizziness.  Symptoms started last night. Pt states that the chest pain is the cental of her chest. Pt states that when she tries to stand up and walk  She become  dizzy.

## 2019-12-23 NOTE — Discharge Instructions (Addendum)
If symptoms worsen go immediately to the emergency department for further.  For shortness of breath I do recommend using the albuterol inhaler every 4-6 hours 2 puffs as needed.  You may take over-the-counter naproxen or ibuprofen as needed for chest wall pain.

## 2019-12-29 ENCOUNTER — Other Ambulatory Visit: Payer: Self-pay

## 2019-12-29 ENCOUNTER — Ambulatory Visit (INDEPENDENT_AMBULATORY_CARE_PROVIDER_SITE_OTHER): Payer: BC Managed Care – PPO

## 2019-12-29 DIAGNOSIS — Z23 Encounter for immunization: Secondary | ICD-10-CM | POA: Diagnosis not present

## 2019-12-29 NOTE — Progress Notes (Signed)
   Covid-19 Vaccination Clinic  Name:  Jenna Blair    MRN: [id number] DOB: 08-10-1985  12/29/2019  Jenna Blair was observed post Covid-19 immunization for 15 minutes without incident. She was provided with Vaccine Information Sheet and instruction to access the V-Safe system.   Jenna Blair was instructed to call 911 with any severe reactions post vaccine: Marland Kitchen Difficulty breathing  . Swelling of face and throat  . A fast heartbeat  . A bad rash all over body  . Dizziness and weakness   Immunizations Administered    Name Date Dose VIS Date Route   Pfizer COVID-19 Vaccine 12/29/2019 10:15 AM 0.3 mL 11/14/2019 Intramuscular   Manufacturer: [employer], [employer]   Lot: [id number]   NDC: [address]-4

## 2020-01-30 ENCOUNTER — Telehealth: Payer: Self-pay

## 2020-01-30 NOTE — Telephone Encounter (Signed)
Patient's Legal Guardian called and stated that they are in need of another COVID Vaccine card for herself, and the other two children. We may call her at [phone] with more information or when the cards are complete and ready for pick up.  
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
## 2020-02-04 DIAGNOSIS — F4312 Post-traumatic stress disorder, chronic: Secondary | ICD-10-CM | POA: Diagnosis not present

## 2020-02-15 DIAGNOSIS — F4312 Post-traumatic stress disorder, chronic: Secondary | ICD-10-CM | POA: Diagnosis not present

## 2020-02-29 ENCOUNTER — Ambulatory Visit (HOSPITAL_COMMUNITY)
Admission: EM | Admit: 2020-02-29 | Discharge: 2020-02-29 | Disposition: A | Discharge status: Home / Self Care | Payer: BC Managed Care – PPO | Attending: Medical Oncology | Admitting: Medical Oncology

## 2020-02-29 ENCOUNTER — Encounter (HOSPITAL_COMMUNITY): Payer: Self-pay | Admitting: Emergency Medicine

## 2020-02-29 ENCOUNTER — Other Ambulatory Visit: Payer: Self-pay

## 2020-02-29 ENCOUNTER — Telehealth: Payer: BC Managed Care – PPO | Admitting: Oncology

## 2020-02-29 DIAGNOSIS — Z113 Encounter for screening for infections with a predominantly sexual mode of transmission: Secondary | ICD-10-CM | POA: Insufficient documentation

## 2020-02-29 DIAGNOSIS — Z79899 Other long term (current) drug therapy: Secondary | ICD-10-CM | POA: Insufficient documentation

## 2020-02-29 DIAGNOSIS — B9689 Other specified bacterial agents as the cause of diseases classified elsewhere: Secondary | ICD-10-CM | POA: Diagnosis not present

## 2020-02-29 DIAGNOSIS — Z7984 Long term (current) use of oral hypoglycemic drugs: Secondary | ICD-10-CM | POA: Diagnosis not present

## 2020-02-29 DIAGNOSIS — N898 Other specified noninflammatory disorders of vagina: Secondary | ICD-10-CM | POA: Insufficient documentation

## 2020-02-29 DIAGNOSIS — N760 Acute vaginitis: Secondary | ICD-10-CM | POA: Diagnosis not present

## 2020-02-29 HISTORY — DX: Prediabetes: R73.03

## 2020-02-29 LAB — HIV ANTIBODY (ROUTINE TESTING W REFLEX): HIV Screen 4th Generation wRfx: NONREACTIVE

## 2020-02-29 MED ORDER — FLUCONAZOLE 200 MG PO TABS: 200.0000 mg | ORAL_TABLET | Freq: Every day | ORAL | 0 refills | Status: AC

## 2020-02-29 NOTE — ED Triage Notes (Signed)
Pt presents with vaginal discharge, odor, and itching xs 2 days.

## 2020-02-29 NOTE — Progress Notes (Signed)
Jenna Blair, Jenna Blair are scheduled for a virtual visit with your provider today.    Just as we do with appointments in the office, we must obtain your consent to participate.  Your consent will be active for this visit and any virtual visit you may have with one of our providers in the next 365 days.    If you have a MyChart account, I can also send a copy of this consent to you electronically.  All virtual visits are billed to your insurance company just like a traditional visit in the office.  As this is a virtual visit, video technology does not allow for your provider to perform a traditional examination.  This may limit your provider's ability to fully assess your condition.  If your provider identifies any concerns that need to be evaluated in person or the need to arrange testing such as labs, EKG, etc, we will make arrangements to do so.    Although advances in technology are sophisticated, we cannot ensure that it will always work on either your end or our end.  If the connection with a video visit is poor, we may have to switch to a telephone visit.  With either a video or telephone visit, we are not always able to ensure that we have a secure connection.   I need to obtain your verbal consent now.   Are you willing to proceed with your visit today?   Jenna Blair has provided verbal consent on 03/02/2020 for a virtual visit (video or telephone).   Mauro Kaufmann, NP 03/02/2020  6:17 PM  Virtual Visit via Video Note  I connected with Jenna Blair on 03/02/20 at  4:30 PM EST by a video enabled telemedicine application and verified that I am speaking with the correct person using two identifiers.  Location: Patient: Home Provider: Home Office   I discussed the limitations of evaluation and management by telemedicine and the availability of in person appointments. The patient expressed understanding and agreed to proceed.  History of Present Illness: Patient is requesting more  information of about BV.   She has past medical history significant for hemorrhoids, OSA and morbid obesity.   She was seen today for vaginal discharge and odor and [facility] urgent care and given a prescription for dilfucan and Falgyl . Pelvic exam and swabs obtained. She is waiting on results.   Reported a 2 day history of vaginal discharge and odor. Has associated vaginal itching.  Had full STI screening as her boyfriend and only partner has been " sketchy" recently.  She denies any known STI exposures.  She has not tried anything for symptoms.  She denies any dysuria, pelvic pain, fevers, abdominal pain, painful pelvic lesions.  She currently has Nexplanon placed for family-planning.   Review of Systems  Constitutional: Negative.  Negative for chills, fever, malaise/fatigue and weight loss.  HENT: Negative for congestion, ear pain and tinnitus.   Eyes: Negative.  Negative for blurred vision and double vision.  Respiratory: Negative.  Negative for cough, sputum production and shortness of breath.   Cardiovascular: Negative.  Negative for chest pain, palpitations and leg swelling.  Gastrointestinal: Negative.  Negative for abdominal pain, constipation, diarrhea, nausea and vomiting.  Genitourinary: Negative for dysuria, frequency and urgency.       Vaginal discharge and odor  Musculoskeletal: Negative for back pain and falls.  Skin: Negative.  Negative for rash.  Neurological: Negative.  Negative for weakness and headaches.  Endo/Heme/Allergies: Negative.  Does  not bruise/bleed easily.  Psychiatric/Behavioral: Negative.  Negative for depression. The patient is not nervous/anxious and does not have insomnia.     Patient Active Problem List   Diagnosis Date Noted  . Sleep apnea 04/20/2017  . Bleeding internal hemorrhoids 01/07/2017  . Morbid obesity (HCC) 12/03/2014  . OSA (obstructive sleep apnea) 10/22/2014    Current Outpatient Medications on File Prior to Visit  Medication Sig Dispense  Refill  . escitalopram (LEXAPRO) 5 MG tablet Take 5 mg by mouth daily.    . fluconazole (DIFLUCAN) 200 MG tablet Take 1 tablet (200 mg total) by mouth daily for 7 days. 7 tablet 0  . metFORMIN (GLUCOPHAGE) 500 MG tablet Take by mouth 2 (two) times daily with a meal.    . tinidazole (TINDAMAX) 500 MG tablet Take 2 tablets (1,000 mg total) by mouth daily with breakfast. 10 tablet 2  . [DISCONTINUED] cetirizine (ZYRTEC) 10 MG tablet Take 1 tablet (10 mg total) by mouth 2 (two) times daily. (Patient not taking: Reported on 08/03/2018) 30 tablet 0  . [DISCONTINUED] ipratropium (ATROVENT) 0.03 % nasal spray Place 2 sprays into both nostrils every 12 (twelve) hours. (Patient not taking: Reported on 08/06/2019)    . [DISCONTINUED] loratadine (CLARITIN) 10 MG tablet Take 1 tablet (10 mg total) by mouth daily. (Patient not taking: Reported on 08/06/2019) 30 tablet 11   No current facility-administered medications on file prior to visit.       Observations/Objective:  VITALS: Per patient if applicable, see vitals. GENERAL: Alert, appears well and in no acute distress. HEENT: Atraumatic, conjunctiva clear, no obvious abnormalities on inspection of external nose and ears. NECK: Normal movements of the head and neck. CARDIOPULMONARY: No increased WOB. Speaking in clear sentences. I:E ratio WNL.  MS: Moves all visible extremities without noticeable abnormality. PSYCH: Pleasant and cooperative, well-groomed. Speech normal rate and rhythm. Affect is appropriate. Insight and judgement are appropriate. Attention is focused, linear, and appropriate.  NEURO: CN grossly intact. Oriented as arrived to appointment on time with no prompting. Moves both UE equally.  SKIN: No obvious lesions, wounds, erythema, or cyanosis noted on face or hands.  Assessment and Plan:  Bacterial Vaginosis: -Wet prep and STI testing obtained at urgent care. -She was given a prescription for Diflucan for yeast. -Had full STI work-up  with pending results. RPR neg, HIV neg -Has questions about bacterial vaginosis and requesting additional educational materials. -Patient self reports being positive for BV although she is not being treated. -Will send RX for MetroGel vaginal applicator to treat for BV. -Have sent message to urgent care provider for results. -Patient in agreement with this plan.   No red flags on discussion, patient is not in any obvious distress during our visit.    Follow Up Instructions:  Follow-up with PCP as needed. Encouraged to call clinic if symptoms worsen or fail to improve.  I discussed the assessment and treatment plan with the patient. The patient was provided an opportunity to ask questions and all were answered. The patient agreed with the plan and demonstrated an understanding of the instructions.   The patient was advised to call back or seek an in-person evaluation if the symptoms worsen or if the condition fails to improve as anticipated.  I provided 12 minutes of face-to-face time during this encounter.   Mauro Kaufmann, NP

## 2020-02-29 NOTE — Patient Instructions (Signed)
Bacterial Vaginosis  Bacterial vaginosis is an infection of the vagina. It happens when too many normal germs (healthy bacteria) grow in the vagina. This infection can make it easier to get other infections from sex (STIs). It is very important for pregnant women to get treated. This infection can cause babies to be born early or at a low birth weight. What are the causes? This infection is caused by an increase in certain germs that grow in the vagina. You cannot get this infection from toilet seats, bedsheets, swimming pools, or things that touch your vagina. What increases the risk?  Having sex with a new person or more than one person.  Having sex without protection.  Douching.  Having an intrauterine device (IUD).  Smoking.  Using drugs or drinking alcohol. These can lead you to do things that are risky.  Taking certain antibiotic medicines.  Being pregnant. What are the signs or symptoms? Some women have no symptoms. Symptoms may include:  A discharge from your vagina. It may be gray or white. It can be watery or foamy.  A fishy smell. This can happen after sex or during your menstrual period.  Itching in and around your vagina.  A feeling of burning or pain when you pee (urinate). How is this treated? This infection is treated with antibiotic medicines. These may be given to you as:  A pill.  A cream for your vagina.  A medicine that you put into your vagina (suppository). If the infection comes back after treatment, you may need more antibiotics. Follow these instructions at home: Medicines  Take over-the-counter and prescription medicines as told by your doctor.  Take or use your antibiotic medicine as told by your doctor. Do not stop taking or using it, even if you start to feel better. General instructions  If the person you have sex with is a woman, tell her that you have this infection. She will need to follow up with her doctor. If you have a female  partner, he does not need to be treated.  Do not have sex until you finish treatment.  Drink enough fluid to keep your pee pale yellow.  Keep your vagina and butt clean. ? Wash the area with warm water each day. ? Wipe from front to back after you use the toilet.  If you are breastfeeding a baby, ask your doctor if you should keep doing so during treatment.  Keep all follow-up visits. How is this prevented? Self-care  Do not douche.  Use only warm water to wash around your vagina.  Wear underwear that is cotton or lined with cotton.  Do not wear tight pants and pantyhose, especially in the summer. Safe sex  Use protection when you have sex. This includes: ? Use condoms. ? Use dental dams. This is a thin layer that protects the mouth during oral sex.  Limit how many people you have sex with. To prevent this infection, it is best to have sex with just one person.  Get tested for STIs. The person you have sex with should also get tested. Drugs and alcohol  Do not smoke or use any products that contain nicotine or tobacco. If you need help quitting, ask your doctor.  Do not use drugs.  Do not drink alcohol if: ? Your doctor tells you not to drink. ? You are pregnant, may be pregnant, or are planning to become pregnant.  If you drink alcohol: ? Limit how much you have to 0-1 drink   a day. ? Know how much alcohol is in your drink. In the [place] one drink equals one 12 oz bottle of beer (355 mL), one 5 oz glass of wine (148 mL), or one 1 oz glass of hard liquor (44 mL). Where to find more information  Centers for Disease Control and Prevention: FootballExhibition.com.br  American Sexual Health Association: www.ashastd.org  Office on [employer] [facility]: http://hoffman.com/ Contact a doctor if:  Your symptoms do not get better, even after you are treated.  You have more discharge or pain when you pee.  You have a fever or chills.  You have pain in your belly (abdomen) or in the area  between your hips.  You have pain with sex.  You bleed from your vagina between menstrual periods. Summary  This infection can happen when too many germs (bacteria) grow in the vagina.  This infection can make it easier to get infections from sex (STIs). Treating this can lower that chance.  Get treated if you are pregnant. This infection can cause babies to be born early.  Do not stop taking or using your antibiotic medicine, even if you start to feel better. This information is not intended to replace advice given to you by your health care provider. Make sure you discuss any questions you have with your health care provider. Document Revised: 07/12/2019 Document Reviewed: 07/12/2019 Elsevier Patient Education  2021 Elsevier Inc. Metronidazole tablets or capsules What is this medicine? METRONIDAZOLE (me troe NI da zole) is an antiinfective. It is used to treat certain kinds of bacterial and protozoal infections. It will not work for colds, flu, or other viral infections. This medicine may be used for other purposes; ask your health care provider or pharmacist if you have questions. COMMON BRAND NAME(S): Flagyl What should I tell my health care provider before I take this medicine? They need to know if you have any of these conditions:  Cockayne syndrome  history of blood diseases such as sickle cell anemia, anemia, or leukemia  if you often drink alcohol  irregular heartbeat or rhythm  kidney disease  liver disease  yeast or fungal infection  an unusual or allergic reaction to metronidazole, nitroimidazoles, or other medicines, foods, dyes, or preservatives  pregnant or trying to get pregnant  breast-feeding How should I use this medicine? Take this medicine by mouth with water. Take it as directed on the prescription label at the same time every day. Take all of this medicine unless your health care provider tells you to stop it early. Keep taking it even if you think  you are better. Talk to your health care provider about the use of this medicine in children. While it may be prescribed for children for selected conditions, precautions do apply. Overdosage: If you think you have taken too much of this medicine contact a poison control center or emergency room at once. NOTE: This medicine is only for you. Do not share this medicine with others. What if I miss a dose? If you miss a dose, take it as soon as you can. If it is almost time for your next dose, take only that dose. Do not take double or extra doses. What may interact with this medicine? Do not take this medicine with any of the following medications:  alcohol or any product that contains alcohol  cisapride  disulfiram  dronedarone  pimozide  thioridazine This medicine may also interact with the following medications:  birth control pills  busulfan  carbamazepine  certain  medicines that treat or prevent blood clots like warfarin  cimetidine  lithium  other medicines that prolong the QT interval (cause an abnormal heart rhythm)  phenobarbital  phenytoin This list may not describe all possible interactions. Give your health care provider a list of all the medicines, herbs, non-prescription drugs, or dietary supplements you use. Also tell them if you smoke, drink alcohol, or use illegal drugs. Some items may interact with your medicine. What should I watch for while using this medicine? Tell your health care provider if your symptoms do not start to get better or if they get worse. Some products may contain alcohol. Ask your health care provider if this medicine contains alcohol. Be sure to tell all health care providers you are taking this medicine. Certain medicines, such as metronidazole and disulfiram, can cause an unpleasant reaction when taken with alcohol. The reaction includes flushing, headache, nausea, vomiting, sweating, and increased thirst. The reaction can last from 30  minutes to several hours. If you are being treated for a sexually transmitted disease (STD), avoid sexual contact until you have finished your treatment. Your sexual partner may also need treatment. Birth control may not work properly while you are taking this medicine. Talk to your health care provider about using an extra method of birth control. What side effects may I notice from receiving this medicine? Side effects that you should report to your doctor or health care professional as soon as possible:  allergic reactions like skin rash, itching or hives, swelling of the face, lips, or tongue  confusion  fast, irregular heartbeat  light-colored stool  liver injury (dark yellow or brown urine; general ill feeling or flu-like symptoms; loss of appetite, right upper belly pain; unusually weak or tired, yellowing of the eyes or skin)  pain, tingling, numbness in the hands or feet  rash, fever, and swollen lymph nodes  redness, blistering, peeling or loosening of the skin, including inside the mouth  seizures  unusual vaginal discharge, itching, or odor Side effects that usually do not require medical attention (report to your doctor or health care professional if they continue or are bothersome):  change in taste  diarrhea  headache  nausea  stomach pain  vomiting This list may not describe all possible side effects. Call your doctor for medical advice about side effects. You may report side effects to FDA at 1-800-FDA-1088. Where should I keep my medicine? Keep out of the reach of children and pets. Store between 15 and 25 degrees C (59 and 77 degrees F). Protect from light. Get rid of any unused medicine after the expiration date. To get rid of medicines that are no longer needed or have expired:  Take the medicine to a medicine take-back program. Check with your pharmacy or law enforcement to find a location.  If you cannot return the medicine, check the label or  package insert to see if the medicine should be thrown out in the garbage or flushed down the toilet. If you are not sure, ask your health care provider. If it is safe to put it in the trash, take the medicine out of the container. Mix the medicine with cat litter, dirt, coffee grounds, or other unwanted substance. Seal the mixture in a bag or container. Put it in the trash. NOTE: This sheet is a summary. It may not cover all possible information. If you have questions about this medicine, talk to your doctor, pharmacist, or health care provider.  2021 Elsevier/Gold Standard (2019-04-17  09:24:11)  

## 2020-02-29 NOTE — ED Provider Notes (Signed)
[facility]    CSN: [id number] Arrival date & time: 02/29/20  3220      History   Chief Complaint Chief Complaint  Patient presents with  . Vaginal Discharge  . vaginal odor    HPI Jenna Blair is a 35 y.o. female.   HPI   Vaginal Discharge: Pt reports a 2 day history of vaginal discharge and odor. Has associated vaginal itching.  She states that she would like full STI screening as her boyfriend and only partner has been " sketchy" recently.  She denies any known STI exposures.  She has not tried anything for symptoms.  She denies any dysuria, pelvic pain, fevers, abdominal pain, painful pelvic lesions.  She currently has Nexplanon placed for family-planning.     Past Medical History:  Diagnosis Date  . Depression   . Environmental allergies   . Pre-diabetes   . Sleep apnea    Uses C Pap nightly    Patient Active Problem List   Diagnosis Date Noted  . Sleep apnea 04/20/2017  . Bleeding internal hemorrhoids 01/07/2017  . Morbid obesity (HCC) 12/03/2014  . OSA (obstructive sleep apnea) 10/22/2014    Past Surgical History:  Procedure Laterality Date  . CESAREAN SECTION     x1  . LEG SURGERY Right    Injury as a child  . TONSILLECTOMY/ADENOIDECTOMY/TURBINATE REDUCTION Bilateral 04/20/2017   Procedure: TONSILLECTOMY/ADENOIDECTOMY AND BILATERAL TURBINATE REDUCTION;  Surgeon: Flo Shanks, MD;  Location: [facility] OR;  Service: ENT;  Laterality: Bilateral;  . TOOTH EXTRACTION      OB History    Gravida  1   Para      Term      Preterm      AB      Living  1     SAB      IAB      Ectopic      Multiple      Live Births  1            Home Medications    Prior to Admission medications   Medication Sig Start Date End Date Taking? Authorizing Provider  metFORMIN (GLUCOPHAGE) 500 MG tablet Take by mouth 2 (two) times daily with a meal.   Yes [provider]  cetirizine (ZYRTEC) 10 MG tablet Take 1 tablet (10 mg total) by  mouth 2 (two) times daily. Patient not taking: Reported on 08/03/2018 02/28/18   Eustace Moore, MD  escitalopram (LEXAPRO) 5 MG tablet Take 5 mg by mouth daily.    [provider]  ipratropium (ATROVENT) 0.03 % nasal spray Place 2 sprays into both nostrils every 12 (twelve) hours. Patient not taking: Reported on 08/06/2019    [provider]  loratadine (CLARITIN) 10 MG tablet Take 1 tablet (10 mg total) by mouth daily. Patient not taking: Reported on 08/06/2019 08/03/18   Brock Bad, MD  methylPREDNISolone (MEDROL DOSEPAK) 4 MG TBPK tablet tad Patient not taking: Reported on 08/06/2019 02/28/18   Eustace Moore, MD  naproxen (EC-NAPROSYN) 500 MG EC tablet Take 1 tablet (500 mg total) by mouth 2 (two) times daily with a meal. Patient not taking: Reported on 08/06/2019 08/03/18   Brock Bad, MD  phentermine (ADIPEX-P) 37.5 MG tablet Take 37.5 mg by mouth daily. Patient not taking: Reported on 08/06/2019 06/28/17   [provider]  phentermine 15 MG capsule Take 15 mg by mouth 2 (two) times daily. 12/03/19   [provider]  tinidazole (TINDAMAX) 500 MG tablet Take 2 tablets (1,000 mg total) by mouth daily with breakfast. 08/06/19   Brock Bad, MD    Family History Family History  Problem Relation Age of Onset  . Diabetes Other   . Heart disease Other   . Hyperlipidemia Other   . Hypertension Other   . Stroke Other   . Thyroid disease Other   . Diabetes Maternal Aunt   . Diabetes Paternal Aunt     Social History Social History   Tobacco Use  . Smoking status: Never Smoker  . Smokeless tobacco: Never Used  Vaping Use  . Vaping Use: Never used  Substance Use Topics  . Alcohol use: Yes    Comment: occasional  . Drug use: No     Allergies   Patient has no known allergies.   Review of Systems Review of Systems  As stated above in HPI  Physical Exam Triage Vital Signs ED Triage Vitals [02/29/20 1032]  Enc Vitals Group      BP      Pulse      Resp      Temp      Temp src      SpO2      Weight      Height      Head Circumference      Peak Flow      Pain Score 0     Pain Loc      Pain Edu?      Excl. in GC?    No data found.  Updated Vital Signs There were no vitals taken for this visit.  Physical Exam Vitals and nursing note reviewed.  Constitutional:      General: She is not in acute distress.    Appearance: She is not ill-appearing, toxic-appearing or diaphoretic.  Cardiovascular:     Rate and Rhythm: Normal rate and regular rhythm.     Heart sounds: Normal heart sounds.  Pulmonary:     Effort: Pulmonary effort is normal.     Breath sounds: Normal breath sounds.  Abdominal:     General: Abdomen is flat.     Palpations: Abdomen is soft.     Tenderness: There is no abdominal tenderness. There is no guarding.  Lymphadenopathy:     Cervical: No cervical adenopathy.  Neurological:     Mental Status: She is alert.      UC Treatments / Results  Labs (all labs ordered are listed, but only abnormal results are displayed) Labs Reviewed - No data to display  EKG   Radiology No results found.  Procedures Procedures (including critical care time)  Medications Ordered in UC Medications - No data to display  Initial Impression / Assessment and Plan / UC Course  I have reviewed the triage vital signs and the nursing notes.  Pertinent labs & imaging results that were available during my care of the patient were reviewed by me and considered in my medical decision making (see chart for details).     New.  We discussed testing options that we have available in urgent care.  Given her vaginal itching I am going to treat with Diflucan for Vaginal candidiasis while we await her lab results. Safe sex practices discussed. Red flag symptoms and signs discussed.   Final Clinical Impressions(s) / UC Diagnoses   Final diagnoses:  None   Discharge Instructions   None    ED  Prescriptions    None  PDMP not reviewed this encounter.   Rushie Chestnut, [place] 02/29/20 1127

## 2020-03-01 DIAGNOSIS — F4312 Post-traumatic stress disorder, chronic: Secondary | ICD-10-CM | POA: Diagnosis not present

## 2020-03-01 LAB — RPR: RPR Ser Ql: NONREACTIVE

## 2020-03-03 LAB — CERVICOVAGINAL ANCILLARY ONLY
Bacterial Vaginitis (gardnerella): NEGATIVE
Candida Glabrata: NEGATIVE
Candida Vaginitis: POSITIVE — AB
Chlamydia: NEGATIVE
Comment: NEGATIVE
Comment: NEGATIVE
Comment: NEGATIVE
Comment: NEGATIVE
Comment: NORMAL
Neisseria Gonorrhea: NEGATIVE

## 2020-03-03 MED ORDER — METRONIDAZOLE 0.75 % VA GEL: 1.0000 | Freq: Two times a day (BID) | VAGINAL | 0 refills | Status: DC

## 2020-03-29 DIAGNOSIS — F4312 Post-traumatic stress disorder, chronic: Secondary | ICD-10-CM | POA: Diagnosis not present

## 2020-04-19 DIAGNOSIS — F4312 Post-traumatic stress disorder, chronic: Secondary | ICD-10-CM | POA: Diagnosis not present

## 2020-04-30 DIAGNOSIS — F4312 Post-traumatic stress disorder, chronic: Secondary | ICD-10-CM | POA: Diagnosis not present

## 2020-05-19 ENCOUNTER — Ambulatory Visit (HOSPITAL_COMMUNITY)
Admission: EM | Admit: 2020-05-19 | Discharge: 2020-05-19 | Disposition: A | Discharge status: Home / Self Care | Payer: BC Managed Care – PPO | Attending: Emergency Medicine | Admitting: Emergency Medicine

## 2020-05-19 ENCOUNTER — Other Ambulatory Visit: Payer: Self-pay

## 2020-05-19 ENCOUNTER — Encounter (HOSPITAL_COMMUNITY): Payer: Self-pay

## 2020-05-19 DIAGNOSIS — Z3202 Encounter for pregnancy test, result negative: Secondary | ICD-10-CM

## 2020-05-19 DIAGNOSIS — N898 Other specified noninflammatory disorders of vagina: Secondary | ICD-10-CM | POA: Diagnosis not present

## 2020-05-19 DIAGNOSIS — Z113 Encounter for screening for infections with a predominantly sexual mode of transmission: Secondary | ICD-10-CM | POA: Diagnosis not present

## 2020-05-19 LAB — POCT URINALYSIS DIPSTICK, ED / UC
Bilirubin Urine: NEGATIVE
Glucose, UA: NEGATIVE mg/dL
Hgb urine dipstick: NEGATIVE
Ketones, ur: NEGATIVE mg/dL
Leukocytes,Ua: NEGATIVE
Nitrite: NEGATIVE
Protein, ur: NEGATIVE mg/dL
Specific Gravity, Urine: 1.02 (ref 1.005–1.030)
Urobilinogen, UA: 1 mg/dL (ref 0.0–1.0)
pH: 7.5 (ref 5.0–8.0)

## 2020-05-19 LAB — POC URINE PREG, ED: Preg Test, Ur: NEGATIVE

## 2020-05-19 NOTE — Discharge Instructions (Signed)
Bacterial vaginosis is not sexually transmitted, nor is it something men are symptomatic of. Sex can trigger BV for some women, however.  We are testing you today for gonorrhea, chlamydia, trichomonas, BV and yeast.  We will notify of you any positive findings or if any changes to treatment are needed. If normal or otherwise without concern to your results, we will not call you. Please log on to your MyChart to review your results if interested in so.

## 2020-05-19 NOTE — ED Provider Notes (Addendum)
[facility]    CSN: [id number] Arrival date & time: 05/19/20  1531      History   Chief Complaint Chief Complaint  Patient presents with  . Vaginal Discharge    HPI Jenna Blair is a 35 y.o. female.   Jenna Blair presents with concerns about her vaginal discharge. She hasn't noticed any significant difference, as she does experience changes to her period cyclically. No odor. No vaginal itching. No vaginal bleeding. She doesn't have periods related to her birth control. No urinary symptoms. She states her partner told her had a "bacterial infection" so she is seeking evaluation. His testing was negative for gonorrhea and chlamydia. He had received empiric Im medication, presumptively rocephin, prior to the negative results. She does not have any other partners. No pelvic pain.     ROS per HPI, negative if not otherwise mentioned.      Past Medical History:  Diagnosis Date  . Depression   . Environmental allergies   . Pre-diabetes   . Sleep apnea    Uses C Pap nightly    Patient Active Problem List   Diagnosis Date Noted  . Sleep apnea 04/20/2017  . Bleeding internal hemorrhoids 01/07/2017  . Morbid obesity (HCC) 12/03/2014  . OSA (obstructive sleep apnea) 10/22/2014    Past Surgical History:  Procedure Laterality Date  . CESAREAN SECTION     x1  . LEG SURGERY Right    Injury as a child  . TONSILLECTOMY/ADENOIDECTOMY/TURBINATE REDUCTION Bilateral 04/20/2017   Procedure: TONSILLECTOMY/ADENOIDECTOMY AND BILATERAL TURBINATE REDUCTION;  Surgeon: Flo Shanks, MD;  Location: [facility] OR;  Service: ENT;  Laterality: Bilateral;  . TOOTH EXTRACTION      OB History    Gravida  1   Para      Term      Preterm      AB      Living  1     SAB      IAB      Ectopic      Multiple      Live Births  1            Home Medications    Prior to Admission medications   Medication Sig Start Date End Date Taking? Authorizing Provider   etonogestrel (NEXPLANON) 68 MG IMPL implant 1 each by Subdermal route once.   Yes [provider]  escitalopram (LEXAPRO) 5 MG tablet Take 5 mg by mouth daily.    [provider]  metFORMIN (GLUCOPHAGE) 500 MG tablet Take by mouth 2 (two) times daily with a meal.    [provider]  metroNIDAZOLE (METROGEL VAGINAL) 0.75 % vaginal gel Place 1 Applicatorful vaginally 2 (two) times daily. 03/03/20   Mauro Kaufmann, NP  tinidazole (TINDAMAX) 500 MG tablet Take 2 tablets (1,000 mg total) by mouth daily with breakfast. 08/06/19   Brock Bad, MD  cetirizine (ZYRTEC) 10 MG tablet Take 1 tablet (10 mg total) by mouth 2 (two) times daily. Patient not taking: Reported on 08/03/2018 02/28/18 02/29/20  Eustace Moore, MD  ipratropium (ATROVENT) 0.03 % nasal spray Place 2 sprays into both nostrils every 12 (twelve) hours. Patient not taking: Reported on 08/06/2019  02/29/20  [provider]  loratadine (CLARITIN) 10 MG tablet Take 1 tablet (10 mg total) by mouth daily. Patient not taking: Reported on 08/06/2019 08/03/18 02/29/20  Brock Bad, MD    Family History Family History  Problem Relation Age of Onset  .  Diabetes Other   . Heart disease Other   . Hyperlipidemia Other   . Hypertension Other   . Stroke Other   . Thyroid disease Other   . Diabetes Maternal Aunt   . Diabetes Paternal Aunt     Social History Social History   Tobacco Use  . Smoking status: Never Smoker  . Smokeless tobacco: Never Used  Vaping Use  . Vaping Use: Never used  Substance Use Topics  . Alcohol use: Yes    Comment: occasional  . Drug use: No     Allergies   Patient has no known allergies.   Review of Systems Review of Systems   Physical Exam Triage Vital Signs ED Triage Vitals  Enc Vitals Group     BP 05/19/20 1610 (!) 143/90     Pulse Rate 05/19/20 1610 79     Resp 05/19/20 1610 18     Temp 05/19/20 1610 98.5 F (36.9 C)     Temp Source 05/19/20 1610  Oral     SpO2 05/19/20 1610 100 %     Weight --      Height --      Head Circumference --      Peak Flow --      Pain Score 05/19/20 1609 0     Pain Loc --      Pain Edu? --      Excl. in GC? --    No data found.  Updated Vital Signs BP (!) 143/90 (BP Location: Right Wrist)   Pulse 79   Temp 98.5 F (36.9 C) (Oral)   Resp 18   SpO2 100%   Visual Acuity Right Eye Distance:   Left Eye Distance:   Bilateral Distance:    Right Eye Near:   Left Eye Near:    Bilateral Near:     Physical Exam Constitutional:      General: She is not in acute distress.    Appearance: She is well-developed.  Cardiovascular:     Rate and Rhythm: Normal rate.  Pulmonary:     Effort: Pulmonary effort is normal.  Abdominal:     Palpations: Abdomen is not rigid.     Tenderness: There is no abdominal tenderness. There is no guarding or rebound.  Genitourinary:    Comments: Denies sores, lesions, vaginal bleeding; no pelvic pain; gu exam deferred at this time, vaginal self swab collected.   Skin:    General: Skin is warm and dry.  Neurological:     Mental Status: She is alert and oriented to person, place, and time.      UC Treatments / Results  Labs (all labs ordered are listed, but only abnormal results are displayed) Labs Reviewed  POC URINE PREG, ED  POCT URINALYSIS DIPSTICK, ED / UC  CERVICOVAGINAL ANCILLARY ONLY    EKG   Radiology No results found.  Procedures Procedures (including critical care time)  Medications Ordered in UC Medications - No data to display  Initial Impression / Assessment and Plan / UC Course  I have reviewed the triage vital signs and the nursing notes.  Pertinent labs & imaging results that were available during my care of the patient were reviewed by me and considered in my medical decision making (see chart for details).     Will await vaginal cytology to determine if any medications are needed. Per patient her boyfriend was negative, and  she does not have an concerning symptoms. BV education provided as well. Patient  verbalized understanding and agreeable to plan.   Final Clinical Impressions(s) / UC Diagnoses   Final diagnoses:  Screen for STD (sexually transmitted disease)  Vaginal discharge     Discharge Instructions     Bacterial vaginosis is not sexually transmitted, nor is it something men are symptomatic of. Sex can trigger BV for some women, however.  We are testing you today for gonorrhea, chlamydia, trichomonas, BV and yeast.  We will notify of you any positive findings or if any changes to treatment are needed. If normal or otherwise without concern to your results, we will not call you. Please log on to your MyChart to review your results if interested in so.     ED Prescriptions    None     PDMP not reviewed this encounter.   Georgetta Haber, NP 05/19/20 1656    Georgetta Haber, NP 05/19/20 2039

## 2020-05-19 NOTE — ED Triage Notes (Signed)
Pt reports white vaginal discharge x 1 week.   Pt requested STD's testing.

## 2020-05-20 ENCOUNTER — Telehealth: Payer: BC Managed Care – PPO | Admitting: Physician Assistant

## 2020-05-20 ENCOUNTER — Encounter: Payer: Self-pay | Admitting: Physician Assistant

## 2020-05-20 ENCOUNTER — Ambulatory Visit (HOSPITAL_COMMUNITY): Payer: BC Managed Care – PPO

## 2020-05-20 DIAGNOSIS — N760 Acute vaginitis: Secondary | ICD-10-CM | POA: Diagnosis not present

## 2020-05-20 DIAGNOSIS — B9689 Other specified bacterial agents as the cause of diseases classified elsewhere: Secondary | ICD-10-CM

## 2020-05-20 LAB — CERVICOVAGINAL ANCILLARY ONLY
Bacterial Vaginitis (gardnerella): POSITIVE — AB
Candida Glabrata: NEGATIVE
Candida Vaginitis: NEGATIVE
Chlamydia: NEGATIVE
Comment: NEGATIVE
Comment: NEGATIVE
Comment: NEGATIVE
Comment: NEGATIVE
Comment: NEGATIVE
Comment: NORMAL
Neisseria Gonorrhea: NEGATIVE
Trichomonas: NEGATIVE

## 2020-05-20 MED ORDER — METRONIDAZOLE 500 MG PO TABS: 500.0000 mg | ORAL_TABLET | Freq: Two times a day (BID) | ORAL | 0 refills | Status: DC

## 2020-05-20 NOTE — Progress Notes (Signed)
Ms. Jenna Blair, Jenna Blair are scheduled for a virtual visit with your provider today.    Just as we do with appointments in the office, we must obtain your consent to participate.  Your consent will be active for this visit and any virtual visit you may have with one of our providers in the next 365 days.    If you have a MyChart account, I can also send a copy of this consent to you electronically.  All virtual visits are billed to your insurance company just like a traditional visit in the office.  As this is a virtual visit, video technology does not allow for your provider to perform a traditional examination.  This may limit your provider's ability to fully assess your condition.  If your provider identifies any concerns that need to be evaluated in person or the need to arrange testing such as labs, EKG, etc, we will make arrangements to do so.    Although advances in technology are sophisticated, we cannot ensure that it will always work on either your end or our end.  If the connection with a video visit is poor, we may have to switch to a telephone visit.  With either a video or telephone visit, we are not always able to ensure that we have a secure connection.   I need to obtain your verbal consent now.   Are you willing to proceed with your visit today?   Jenna Blair has provided verbal consent on 05/20/2020 for a virtual visit (video or telephone).  Piedad Climes, PA-C 05/20/2020  4:54 PM  Virtual Visit via Video   I connected with patient on 05/20/20 at  5:00 PM EDT by a video enabled telemedicine application and verified that I am speaking with the correct person using two identifiers.  Location patient: [facility] person provider: [facility] Persons participating in the virtual visit: Patient, Provider  I discussed the limitations of evaluation and management by telemedicine and the availability of in person appointments. The patient expressed understanding and  agreed to proceed.  Subjective:   HPI:   Patient presents via [facility] today with concerns about Urgent Care results. Patient was seen at [facility] yesterday w complaints of vaginal discharge and irritation after being told by her female partner that he had a "bacterial" infection. Patient states her partner was treated for GC/Chlamydia empirically but his tests were negative for this. State he had a urinary infection which is being treated. She was nervous as she was not sure what he had and because of her vaginal discharge. UC workup included negative urine pregnancy and urinalysis. No culture indicated. Vaginal exam with swab obtained. She states she got the results back via MyChart but she cannot get in touch with UC provider regarding her results and what they mean. .  ROS:   See pertinent positives and negatives per HPI.  Patient Active Problem List   Diagnosis Date Noted  . Sleep apnea 04/20/2017  . Bleeding internal hemorrhoids 01/07/2017  . Morbid obesity (HCC) 12/03/2014  . OSA (obstructive sleep apnea) 10/22/2014    Social History   Tobacco Use  . Smoking status: Never Smoker  . Smokeless tobacco: Never Used  Substance Use Topics  . Alcohol use: Yes    Comment: occasional    Current Outpatient Medications:  .  escitalopram (LEXAPRO) 5 MG tablet, Take 5 mg by mouth daily., Disp: , Rfl:  .  etonogestrel (NEXPLANON) 68 MG IMPL implant, 1 each  by Subdermal route once., Disp: , Rfl:  .  metFORMIN (GLUCOPHAGE) 500 MG tablet, Take by mouth 2 (two) times daily with a meal., Disp: , Rfl:  .  metroNIDAZOLE (METROGEL VAGINAL) 0.75 % vaginal gel, Place 1 Applicatorful vaginally 2 (two) times daily., Disp: 70 g, Rfl: 0 .  tinidazole (TINDAMAX) 500 MG tablet, Take 2 tablets (1,000 mg total) by mouth daily with breakfast., Disp: 10 tablet, Rfl: 2  No Known Allergies  Objective:   There were no vitals taken for this visit.  Patient is well-developed, well-nourished in no acute  distress.  Resting comfortably in parked car. Head is normocephalic, atraumatic.  No labored breathing.  Speech is clear and coherent with logical content.  Patient is alert and oriented at baseline.   Assessment and Plan:   1. Bacterial vaginitis Urine ancillary negative for yeast, gonorrhea, chlamydia, trichomonas. + for gardnerella. Currently with symptoms. Will treat with Flagyl. Home care and prevention discussed. Recommend to be cautious she should request her significant other be retested before re-engaging in sexual activity. Also recommend follow-up with PCP for full STI panel to be cautious. Safe sex practices discussed.  - metroNIDAZOLE (FLAGYL) 500 MG tablet; Take 1 tablet (500 mg total) by mouth 2 (two) times daily.  Dispense: 14 tablet; Refill: 0 .   Piedad Climes, [place] 05/20/2020

## 2020-05-28 ENCOUNTER — Ambulatory Visit (INDEPENDENT_AMBULATORY_CARE_PROVIDER_SITE_OTHER): Payer: BC Managed Care – PPO | Admitting: Obstetrics

## 2020-05-28 ENCOUNTER — Encounter: Payer: Self-pay | Admitting: Obstetrics

## 2020-05-28 ENCOUNTER — Other Ambulatory Visit: Payer: Self-pay

## 2020-05-28 VITALS — Wt 246.0 lb

## 2020-05-28 DIAGNOSIS — B9689 Other specified bacterial agents as the cause of diseases classified elsewhere: Secondary | ICD-10-CM | POA: Diagnosis not present

## 2020-05-28 DIAGNOSIS — N760 Acute vaginitis: Secondary | ICD-10-CM

## 2020-05-28 DIAGNOSIS — Z6841 Body Mass Index (BMI) 40.0 and over, adult: Secondary | ICD-10-CM

## 2020-05-28 MED ORDER — METRONIDAZOLE 500 MG PO TABS: 500.0000 mg | ORAL_TABLET | Freq: Two times a day (BID) | ORAL | 4 refills | Status: DC

## 2020-05-28 NOTE — Progress Notes (Signed)
Subjective:        Jenna Blair is a 35 y.o. female here for a routine exam.  Current complaints:  Recurrent BV.  Personal health questionnaire:  Is patient [name], have a family history of breast and/or ovarian cancer: no Is there a family history of uterine cancer diagnosed at age < 21, gastrointestinal cancer, urinary tract cancer, family member who is a Personnel officer syndrome-associated carrier: no Is the patient overweight and hypertensive, family history of diabetes, personal history of gestational diabetes, preeclampsia or PCOS: no Is patient over 60, have PCOS,  family history of premature CHD under age 80, diabetes, smoke, have hypertension or peripheral artery disease:  no At any time, has a partner hit, kicked or otherwise hurt or frightened you?: no Over the past 2 weeks, have you felt down, depressed or hopeless?: no Over the past 2 weeks, have you felt little interest or pleasure in doing things?:no   Gynecologic History No LMP recorded. Patient has had an implant. Contraception: Nexplanon Last Pap: 08-06-2019. Results were: normal Last mammogram: n/a. Results were: n/a  Obstetric History OB History  Gravida Para Term Preterm AB Living  1         1  SAB IAB Ectopic Multiple Live Births          1    # Outcome Date GA Lbr Len/2nd Weight Sex Delivery Anes PTL Lv  1 Gravida 02/20/07     CS-LTranv   LIV    Past Medical History:  Diagnosis Date  . Depression   . Environmental allergies   . Pre-diabetes   . Sleep apnea    Uses C Pap nightly    Past Surgical History:  Procedure Laterality Date  . CESAREAN SECTION     x1  . LEG SURGERY Right    Injury as a child  . TONSILLECTOMY/ADENOIDECTOMY/TURBINATE REDUCTION Bilateral 04/20/2017   Procedure: TONSILLECTOMY/ADENOIDECTOMY AND BILATERAL TURBINATE REDUCTION;  Surgeon: Flo Shanks, MD;  Location: [facility] OR;  Service: ENT;  Laterality: Bilateral;  . TOOTH EXTRACTION       Current Outpatient Medications:   .  escitalopram (LEXAPRO) 5 MG tablet, Take 5 mg by mouth daily., Disp: , Rfl:  .  etonogestrel (NEXPLANON) 68 MG IMPL implant, 1 each by Subdermal route once., Disp: , Rfl:  .  metroNIDAZOLE (FLAGYL) 500 MG tablet, Take 1 tablet (500 mg total) by mouth 2 (two) times daily., Disp: 14 tablet, Rfl: 4 .  metFORMIN (GLUCOPHAGE) 500 MG tablet, Take by mouth 2 (two) times daily with a meal., Disp: , Rfl:  .  metroNIDAZOLE (FLAGYL) 500 MG tablet, Take 1 tablet (500 mg total) by mouth 2 (two) times daily. (Patient not taking: Reported on 05/28/2020), Disp: 14 tablet, Rfl: 0 No Known Allergies  Social History   Tobacco Use  . Smoking status: Never Smoker  . Smokeless tobacco: Never Used  Substance Use Topics  . Alcohol use: Yes    Comment: occasional    Family History  Problem Relation Age of Onset  . Diabetes Other   . Heart disease Other   . Hyperlipidemia Other   . Hypertension Other   . Stroke Other   . Thyroid disease Other   . Diabetes Maternal Aunt   . Diabetes Paternal Aunt       Review of Systems  Constitutional: negative for fatigue and weight loss Respiratory: negative for cough and wheezing Cardiovascular: negative for chest pain, fatigue and palpitations Gastrointestinal: negative for abdominal pain and change  in bowel habits Musculoskeletal:negative for myalgias Neurological: negative for gait problems and tremors Behavioral/Psych: negative for abusive relationship, depression Endocrine: negative for temperature intolerance    Genitourinary:negative for abnormal menstrual periods, genital lesions, hot flashes, sexual problems and vaginal discharge Integument/breast: negative for breast lump, breast tenderness, nipple discharge and skin lesion(s)    Objective:       Wt 246 lb (111.6 kg)   BMI 48.04 kg/m  General:   alert and no distress  Skin:   no rash or abnormalities  Lungs:   clear to auscultation bilaterally  Heart:   regular rate and rhythm, S1, S2 normal,  no murmur, click, rub or gallop  Breasts:   normal without suspicious masses, skin or nipple changes or axillary nodes  Abdomen:  normal findings: no organomegaly, soft, non-tender and no hernia  Pelvis:  External genitalia: normal general appearance Urinary system: urethral meatus normal and bladder without fullness, nontender Vaginal: normal without tenderness, induration or masses Cervix: normal appearance Adnexa: normal bimanual exam Uterus: anteverted and non-tender, normal size   Lab Review Urine pregnancy test Labs reviewed yes Radiologic studies reviewed no  I have spent a total of 15 minutes of face-to-face time, excluding clinical staff time, reviewing notes and preparing to see patient, ordering tests and/or medications, and counseling the patient.  Assessment:     1. BV (bacterial vaginosis) Rx: - metroNIDAZOLE (FLAGYL) 500 MG tablet; Take 1 tablet (500 mg total) by mouth 2 (two) times daily.  Dispense: 14 tablet; Refill: 4  2. Class 3 severe obesity due to excess calories without serious comorbidity with body mass index (BMI) of 45.0 to 49.9 in adult [facility])     Plan:    Education reviewed: calcium supplements, depression evaluation, low fat, low cholesterol diet, safe sex/STD prevention, self breast exams and weight bearing exercise. Follow up in: 1 year.   Meds ordered this encounter  Medications  . metroNIDAZOLE (FLAGYL) 500 MG tablet    Sig: Take 1 tablet (500 mg total) by mouth 2 (two) times daily.    Dispense:  14 tablet    Refill:  4     Brock Bad, MD 05/28/2020 4:53 PM

## 2020-05-28 NOTE — Progress Notes (Addendum)
GYN presents for FU to +BV, she got treated on 05/20/2020; last week Tuesday.  She was told to FU with her OB/GYN.  Patient reports that sx are clearing up.

## 2020-05-28 NOTE — Patient Instructions (Signed)
Bacterial Vaginosis  Bacterial vaginosis is an infection that occurs when the normal balance of bacteria in the vagina changes. This change is caused by an overgrowth of certain bacteria in the vagina. Bacterial vaginosis is the most common vaginal infection among females aged 34 to 44 years. This condition increases the risk of sexually transmitted infections (STIs). Treatment can help reduce this risk. Treatment is very important for pregnant women because this condition can cause babies to be born early (prematurely) or at a low birth weight. What are the causes? This condition is caused by an increase in harmful bacteria that are normally present in small amounts in the vagina. However, the exact reason this condition develops is not known. You cannot get bacterial vaginosis from toilet seats, bedding, swimming pools, or contact with objects around you. What increases the risk? The following factors may make you more likely to develop this condition:  Having a new sexual partner or multiple sexual partners, or having unprotected sex.  Douching.  Having an intrauterine device (IUD).  Smoking.  Abusing drugs and alcohol. This may lead to riskier sexual behavior.  Taking certain antibiotic medicines.  Being pregnant. What are the signs or symptoms? Some women with this condition have no symptoms. Symptoms may include:  Gray or white vaginal discharge. The discharge can be watery or foamy.  A fish-like odor with discharge, especially after sex or during menstruation.  Itching in and around the vagina.  Burning or pain with urination. How is this diagnosed? This condition is diagnosed based on:  Your medical history.  A physical exam of the vagina.  Checking a sample of vaginal fluid for harmful bacteria or abnormal cells. How is this treated? This condition is treated with antibiotic medicines. These may be given as a pill, a vaginal cream, or a medicine that is put into the  vagina (suppository). If the condition comes back after treatment, a second round of antibiotics may be needed. Follow these instructions at home: Medicines  Take or apply over-the-counter and prescription medicines only as told by your health care provider.  Take or apply your antibiotic medicine as told by your health care provider. Do not stop using the antibiotic even if you start to feel better. General instructions  If you have a female sexual partner, tell her that you have a vaginal infection. She should follow up with her health care provider. If you have a female sexual partner, he does not need treatment.  Avoid sexual activity until you finish treatment.  Drink enough fluid to keep your urine pale yellow.  Keep the area around your vagina and rectum clean. ? Wash the area daily with warm water. ? Wipe yourself from front to back after using the toilet.  If you are breastfeeding, talk to your health care provider about continuing breastfeeding during treatment.  Keep all follow-up visits. This is important. How is this prevented? Self-care  Do not douche.  Wash the outside of your vagina with warm water only.  Wear cotton or cotton-lined underwear.  Avoid wearing tight pants and pantyhose, especially during the summer. Safe sex  Use protection when having sex. This includes: ? Using condoms. ? Using dental dams. This is a thin layer of a material made of latex or polyurethane that protects the mouth during oral sex.  Limit the number of sexual partners. To help prevent bacterial vaginosis, it is best to have sex with just one partner (monogamous relationship).  Make sure you and your sexual partner   are tested for STIs. Drugs and alcohol  Do not use any products that contain nicotine or tobacco. These products include cigarettes, chewing tobacco, and vaping devices, such as e-cigarettes. If you need help quitting, ask your health care provider.  Do not use  drugs.  Do not drink alcohol if: ? Your health care provider tells you not to do this. ? You are pregnant, may be pregnant, or are planning to become pregnant.  If you drink alcohol: ? Limit how much you have to 0-1 drink a day. ? Be aware of how much alcohol is in your drink. In the [place] one drink equals one 12 oz bottle of beer (355 mL), one 5 oz glass of wine (148 mL), or one 1 oz glass of hard liquor (44 mL). Where to find more information  Centers for Disease Control and Prevention: www.cdc.gov  American Sexual Health Association (ASHA): www.ashastd.org  [place] Department of Health and Human Services, Office on Women's Health: www.womenshealth.gov Contact a health care provider if:  Your symptoms do not improve, even after treatment.  You have more discharge or pain when urinating.  You have a fever or chills.  You have pain in your abdomen or pelvis.  You have pain during sex.  You have vaginal bleeding between menstrual periods. Summary  Bacterial vaginosis is a vaginal infection that occurs when the normal balance of bacteria in the vagina changes. It results from an overgrowth of certain bacteria.  This condition increases the risk of sexually transmitted infections (STIs). Getting treated can help reduce this risk.  Treatment is very important for pregnant women because this condition can cause babies to be born early (prematurely) or at low birth weight.  This condition is treated with antibiotic medicines. These may be given as a pill, a vaginal cream, or a medicine that is put into the vagina (suppository). This information is not intended to replace advice given to you by your health care provider. Make sure you discuss any questions you have with your health care provider. Document Revised: 07/12/2019 Document Reviewed: 07/12/2019 Elsevier Patient Education  2021 Elsevier Inc.  

## 2020-07-21 ENCOUNTER — Encounter: Payer: Self-pay | Admitting: Obstetrics

## 2020-07-21 ENCOUNTER — Other Ambulatory Visit: Payer: Self-pay

## 2020-07-21 ENCOUNTER — Other Ambulatory Visit (HOSPITAL_COMMUNITY)
Admission: RE | Admit: 2020-07-21 | Discharge: 2020-07-21 | Disposition: A | Discharge status: Home / Self Care | Payer: BC Managed Care – PPO | Source: Ambulatory Visit | Attending: Obstetrics | Admitting: Obstetrics

## 2020-07-21 ENCOUNTER — Ambulatory Visit (INDEPENDENT_AMBULATORY_CARE_PROVIDER_SITE_OTHER): Payer: BC Managed Care – PPO | Admitting: Obstetrics

## 2020-07-21 VITALS — BP 127/81 | HR 58 | Ht 60.0 in | Wt 243.8 lb

## 2020-07-21 DIAGNOSIS — A5402 Gonococcal vulvovaginitis, unspecified: Secondary | ICD-10-CM | POA: Diagnosis not present

## 2020-07-21 DIAGNOSIS — Z113 Encounter for screening for infections with a predominantly sexual mode of transmission: Secondary | ICD-10-CM

## 2020-07-21 DIAGNOSIS — Z13220 Encounter for screening for lipoid disorders: Secondary | ICD-10-CM | POA: Diagnosis not present

## 2020-07-21 DIAGNOSIS — N898 Other specified noninflammatory disorders of vagina: Secondary | ICD-10-CM

## 2020-07-21 DIAGNOSIS — Z01419 Encounter for gynecological examination (general) (routine) without abnormal findings: Secondary | ICD-10-CM

## 2020-07-21 DIAGNOSIS — R7303 Prediabetes: Secondary | ICD-10-CM

## 2020-07-21 DIAGNOSIS — F32A Depression, unspecified: Secondary | ICD-10-CM

## 2020-07-21 DIAGNOSIS — Z1329 Encounter for screening for other suspected endocrine disorder: Secondary | ICD-10-CM | POA: Diagnosis not present

## 2020-07-21 DIAGNOSIS — E66813 Obesity, class 3: Secondary | ICD-10-CM

## 2020-07-21 DIAGNOSIS — Z131 Encounter for screening for diabetes mellitus: Secondary | ICD-10-CM | POA: Diagnosis not present

## 2020-07-21 DIAGNOSIS — Z6841 Body Mass Index (BMI) 40.0 and over, adult: Secondary | ICD-10-CM

## 2020-07-21 MED ORDER — METFORMIN HCL 500 MG PO TABS: 1000.0000 mg | ORAL_TABLET | Freq: Two times a day (BID) | ORAL | 11 refills | Status: DC

## 2020-07-21 MED ORDER — ESCITALOPRAM OXALATE 5 MG PO TABS: 5.0000 mg | ORAL_TABLET | Freq: Every day | ORAL | 11 refills | Status: DC

## 2020-07-21 NOTE — Progress Notes (Signed)
Patient ID: Jenna Blair, female   DOB: 1985-05-12, 35 y.o.   MRN: [id number]  Chief Complaint  Patient presents with   Gynecologic Exam    HPI Jenna Blair is a 35 y.o. female.  Presents with history of recurrent BV.  Presents for interval evaluation for BV after last treatment. HPI  Past Medical History:  Diagnosis Date   Depression    Environmental allergies    Pre-diabetes    Sleep apnea    Uses C Pap nightly    Past Surgical History:  Procedure Laterality Date   CESAREAN SECTION     x1   LEG SURGERY Right    Injury as a child   TONSILLECTOMY/ADENOIDECTOMY/TURBINATE REDUCTION Bilateral 04/20/2017   Procedure: TONSILLECTOMY/ADENOIDECTOMY AND BILATERAL TURBINATE REDUCTION;  Surgeon: Flo Shanks, MD;  Location: [facility] OR;  Service: ENT;  Laterality: Bilateral;   TOOTH EXTRACTION      Family History  Problem Relation Age of Onset   Diabetes Other    Heart disease Other    Hyperlipidemia Other    Hypertension Other    Stroke Other    Thyroid disease Other    Diabetes Maternal Aunt    Diabetes Paternal Aunt     Social History Social History   Tobacco Use   Smoking status: Never   Smokeless tobacco: Never  Vaping Use   Vaping Use: Never used  Substance Use Topics   Alcohol use: Yes    Comment: occasional   Drug use: No    No Known Allergies  Current Outpatient Medications  Medication Sig Dispense Refill   etonogestrel (NEXPLANON) 68 MG IMPL implant 1 each by Subdermal route once.     escitalopram (LEXAPRO) 5 MG tablet Take 1 tablet (5 mg total) by mouth daily. 30 tablet 11   metFORMIN (GLUCOPHAGE) 500 MG tablet Take 2 tablets (1,000 mg total) by mouth 2 (two) times daily with a meal. 120 tablet 11   No current facility-administered medications for this visit.    Review of Systems Review of Systems Constitutional: negative for fatigue and weight loss Respiratory: negative for cough and wheezing Cardiovascular: negative for chest pain, fatigue  and palpitations Gastrointestinal: negative for abdominal pain and change in bowel habits Genitourinary:positive for recurrent BV.  Negative for vaginal discharge, odor or irritation Integument/breast: negative for nipple discharge Musculoskeletal:negative for myalgias Neurological: negative for gait problems and tremors Behavioral/Psych: negative for abusive relationship, depression Endocrine: negative for temperature intolerance      Blood pressure 127/81, pulse (!) 58, height 5' (1.524 m), weight 243 lb 12.8 oz (110.6 kg).  Physical Exam Physical Exam General:   Alert and no distress  Skin:   no rash or abnormalities  Lungs:   clear to auscultation bilaterally  Heart:   regular rate and rhythm, S1, S2 normal, no murmur, click, rub or gallop  Breasts:   Not examined  Abdomen:  normal findings: no organomegaly, soft, non-tender and no hernia  Pelvis:  External genitalia: normal general appearance Urinary system: urethral meatus normal and bladder without fullness, nontender Vaginal: normal without tenderness, induration or masses Cervix: normal appearance Adnexa: normal bimanual exam Uterus: anteverted and non-tender, normal size    I have spent a total of 15 minutes of face-to-face time, excluding clinical staff time, reviewing notes and preparing to see patient, ordering tests and/or medications, and counseling the patient.   Data Reviewed Wet Prep  Assessment     1. Vaginal discharge Rx: - Cervicovaginal ancillary only  2. Well  woman exam Rx: - CBC - TSH - Comprehensive metabolic panel  3. Routine screening for STI (sexually transmitted infection) Rx: - Hepatitis B surface antigen - Hepatitis C antibody  4. Screening for diabetes mellitus Rx: - Hemoglobin A1c  5. Screening cholesterol level Rx: - Lipid panel  6. Class 3 severe obesity due to excess calories without serious comorbidity with body mass index (BMI) of 45.0 to 49.9 in adult (HCC) - weight loss  with the aid of dietary changes, exercises and behavioral modification recommended  7. Prediabetes Rx: - metFORMIN (GLUCOPHAGE) 500 MG tablet; Take 2 tablets (1,000 mg total) by mouth 2 (two) times daily with a meal.  Dispense: 120 tablet; Refill: 11  8. Controlled depression Rx: - escitalopram (LEXAPRO) 5 MG tablet; Take 1 tablet (5 mg total) by mouth daily.  Dispense: 30 tablet; Refill: 11     Plan   Follow up in 3 months for Annual / Pap  Orders Placed This Encounter  Procedures   Hepatitis B surface antigen   Hepatitis C antibody   CBC   TSH   Hemoglobin A1c   Comprehensive metabolic panel   Lipid panel   Meds ordered this encounter  Medications   escitalopram (LEXAPRO) 5 MG tablet    Sig: Take 1 tablet (5 mg total) by mouth daily.    Dispense:  30 tablet    Refill:  11   metFORMIN (GLUCOPHAGE) 500 MG tablet    Sig: Take 2 tablets (1,000 mg total) by mouth 2 (two) times daily with a meal.    Dispense:  120 tablet    Refill:  11       Brock Bad, MD 07/21/2020 10:56 PM

## 2020-07-21 NOTE — Progress Notes (Signed)
Refill on Lexapro, Metformin

## 2020-07-22 ENCOUNTER — Other Ambulatory Visit: Payer: Self-pay | Admitting: Obstetrics

## 2020-07-22 DIAGNOSIS — A549 Gonococcal infection, unspecified: Secondary | ICD-10-CM

## 2020-07-22 LAB — COMPREHENSIVE METABOLIC PANEL
ALT: 14 IU/L (ref 0–32)
AST: 16 IU/L (ref 0–40)
Albumin/Globulin Ratio: 1.3 (ref 1.2–2.2)
Albumin: 4 g/dL (ref 3.8–4.8)
Alkaline Phosphatase: 73 IU/L (ref 44–121)
BUN/Creatinine Ratio: 19 (ref 9–23)
BUN: 18 mg/dL (ref 6–20)
Bilirubin Total: <0.2 mg/dL (ref 0.0–1.2)
CO2: 17 mmol/L — ABNORMAL LOW (ref 20–29)
Calcium: 9.7 mg/dL (ref 8.7–10.2)
Chloride: 105 mmol/L (ref 96–106)
Creatinine, Ser: 0.93 mg/dL (ref 0.57–1.00)
Globulin, Total: 3.2 g/dL (ref 1.5–4.5)
Glucose: 103 mg/dL — ABNORMAL HIGH (ref 65–99)
Potassium: 4.6 mmol/L (ref 3.5–5.2)
Sodium: 142 mmol/L (ref 134–144)
Total Protein: 7.2 g/dL (ref 6.0–8.5)
eGFR: 83 mL/min/{1.73_m2} (ref >59)

## 2020-07-22 LAB — LIPID PANEL
Chol/HDL Ratio: 4.7 ratio — ABNORMAL HIGH (ref 0.0–4.4)
Cholesterol, Total: 216 mg/dL — ABNORMAL HIGH (ref 100–199)
HDL: 46 mg/dL (ref >39)
LDL Chol Calc (NIH): 151 mg/dL — ABNORMAL HIGH (ref 0–99)
Triglycerides: 108 mg/dL (ref 0–149)
VLDL Cholesterol Cal: 19 mg/dL (ref 5–40)

## 2020-07-22 LAB — CERVICOVAGINAL ANCILLARY ONLY
Bacterial Vaginitis (gardnerella): NEGATIVE
Candida Glabrata: NEGATIVE
Candida Vaginitis: NEGATIVE
Chlamydia: NEGATIVE
Comment: NEGATIVE
Comment: NEGATIVE
Comment: NEGATIVE
Comment: NEGATIVE
Comment: NEGATIVE
Comment: NORMAL
Neisseria Gonorrhea: POSITIVE — AB
Trichomonas: NEGATIVE

## 2020-07-22 LAB — TSH: TSH: 1.35 u[IU]/mL (ref 0.450–4.500)

## 2020-07-22 LAB — CBC
Hematocrit: 38 % (ref 34.0–46.6)
Hemoglobin: 12.7 g/dL (ref 11.1–15.9)
MCH: 29.5 pg (ref 26.6–33.0)
MCHC: 33.4 g/dL (ref 31.5–35.7)
MCV: 88 fL (ref 79–97)
Platelets: 288 10*3/uL (ref 150–450)
RBC: 4.3 x10E6/uL (ref 3.77–5.28)
RDW: 13.1 % (ref 11.7–15.4)
WBC: 8.1 10*3/uL (ref 3.4–10.8)

## 2020-07-22 LAB — HEMOGLOBIN A1C
Est. average glucose Bld gHb Est-mCnc: 120 mg/dL
Hgb A1c MFr Bld: 5.8 % — ABNORMAL HIGH (ref 4.8–5.6)

## 2020-07-22 LAB — HEPATITIS C ANTIBODY: Hep C Virus Ab: <0.1 {s_co_ratio} (ref 0.0–0.9)

## 2020-07-22 LAB — HEPATITIS B SURFACE ANTIGEN: Hepatitis B Surface Ag: NEGATIVE

## 2020-07-22 MED ORDER — CEFTRIAXONE SODIUM 500 MG IJ SOLR
500.0000 mg | Freq: Once | INTRAMUSCULAR | Status: AC
  Administered 2020-07-23: 14:00:00 500 mg via INTRAMUSCULAR

## 2020-07-23 ENCOUNTER — Other Ambulatory Visit: Payer: Self-pay

## 2020-07-23 ENCOUNTER — Ambulatory Visit (INDEPENDENT_AMBULATORY_CARE_PROVIDER_SITE_OTHER): Payer: BC Managed Care – PPO

## 2020-07-23 VITALS — BP 113/78 | HR 72

## 2020-07-23 DIAGNOSIS — A549 Gonococcal infection, unspecified: Secondary | ICD-10-CM | POA: Diagnosis not present

## 2020-07-23 DIAGNOSIS — Z202 Contact with and (suspected) exposure to infections with a predominantly sexual mode of transmission: Secondary | ICD-10-CM

## 2020-07-23 MED ORDER — CEFTRIAXONE SODIUM 500 MG IJ SOLR
500.0000 mg | Freq: Once | INTRAMUSCULAR | Status: DC
Start: 2020-07-23 — End: 2021-06-27

## 2020-07-23 NOTE — Progress Notes (Signed)
Patient presented to the office for STD treatment for Gonorrhea.  Given by: D.Danee Soller Ceftriaxone 500 mg LUOQ Side Effects: none   Ndc# [phone] Return to clinic in four weeks.

## 2020-08-03 ENCOUNTER — Other Ambulatory Visit: Payer: Self-pay | Admitting: Obstetrics

## 2020-08-03 DIAGNOSIS — F32A Depression, unspecified: Secondary | ICD-10-CM

## 2020-08-11 DIAGNOSIS — F4312 Post-traumatic stress disorder, chronic: Secondary | ICD-10-CM | POA: Diagnosis not present

## 2020-08-15 DIAGNOSIS — F4312 Post-traumatic stress disorder, chronic: Secondary | ICD-10-CM | POA: Diagnosis not present

## 2020-08-20 ENCOUNTER — Other Ambulatory Visit (HOSPITAL_COMMUNITY)
Admission: RE | Admit: 2020-08-20 | Discharge: 2020-08-20 | Disposition: A | Discharge status: Home / Self Care | Payer: BC Managed Care – PPO | Source: Ambulatory Visit | Attending: Obstetrics and Gynecology | Admitting: Obstetrics and Gynecology

## 2020-08-20 ENCOUNTER — Other Ambulatory Visit: Payer: Self-pay

## 2020-08-20 ENCOUNTER — Ambulatory Visit (INDEPENDENT_AMBULATORY_CARE_PROVIDER_SITE_OTHER): Payer: BC Managed Care – PPO

## 2020-08-20 DIAGNOSIS — Z113 Encounter for screening for infections with a predominantly sexual mode of transmission: Secondary | ICD-10-CM | POA: Insufficient documentation

## 2020-08-20 DIAGNOSIS — A549 Gonococcal infection, unspecified: Secondary | ICD-10-CM | POA: Insufficient documentation

## 2020-08-20 DIAGNOSIS — Z8619 Personal history of other infectious and parasitic diseases: Secondary | ICD-10-CM | POA: Insufficient documentation

## 2020-08-20 NOTE — Progress Notes (Signed)
Agree with A & P. 

## 2020-08-20 NOTE — Progress Notes (Signed)
SUBJECTIVE:  35 y.o. female presents for TOC of +GC. Denies vaginal discharge, abnormal vaginal bleeding or significant pelvic pain or fever. No UTI symptoms. Denies history of known exposure to STD.  No LMP recorded. Patient has had an implant.  OBJECTIVE:  She appears well, afebrile. Urine dipstick: not done.  ASSESSMENT:  Not having any symptoms.    PLAN:  Test of Cure GC probe sent to lab. Treatment: To be determined once lab results are received ROV prn if symptoms persist or worsen.

## 2020-08-21 LAB — CERVICOVAGINAL ANCILLARY ONLY
Chlamydia: NEGATIVE
Comment: NEGATIVE
Comment: NORMAL
Neisseria Gonorrhea: NEGATIVE

## 2020-08-22 DIAGNOSIS — F4312 Post-traumatic stress disorder, chronic: Secondary | ICD-10-CM | POA: Diagnosis not present

## 2020-08-29 ENCOUNTER — Other Ambulatory Visit: Payer: Self-pay | Admitting: Obstetrics

## 2020-08-29 DIAGNOSIS — F32A Depression, unspecified: Secondary | ICD-10-CM

## 2020-09-26 DIAGNOSIS — F4312 Post-traumatic stress disorder, chronic: Secondary | ICD-10-CM | POA: Diagnosis not present

## 2020-10-13 DIAGNOSIS — F4312 Post-traumatic stress disorder, chronic: Secondary | ICD-10-CM | POA: Diagnosis not present

## 2020-10-31 DIAGNOSIS — F4312 Post-traumatic stress disorder, chronic: Secondary | ICD-10-CM | POA: Diagnosis not present

## 2020-11-22 DIAGNOSIS — F4312 Post-traumatic stress disorder, chronic: Secondary | ICD-10-CM | POA: Diagnosis not present

## 2021-01-09 DIAGNOSIS — F4312 Post-traumatic stress disorder, chronic: Secondary | ICD-10-CM | POA: Diagnosis not present

## 2021-01-31 DIAGNOSIS — F4312 Post-traumatic stress disorder, chronic: Secondary | ICD-10-CM | POA: Diagnosis not present

## 2021-02-14 DIAGNOSIS — F4312 Post-traumatic stress disorder, chronic: Secondary | ICD-10-CM | POA: Diagnosis not present

## 2021-02-26 DIAGNOSIS — F4312 Post-traumatic stress disorder, chronic: Secondary | ICD-10-CM | POA: Diagnosis not present

## 2021-05-20 DIAGNOSIS — Z1339 Encounter for screening examination for other mental health and behavioral disorders: Secondary | ICD-10-CM | POA: Diagnosis not present

## 2021-05-20 DIAGNOSIS — Z0000 Encounter for general adult medical examination without abnormal findings: Secondary | ICD-10-CM | POA: Diagnosis not present

## 2021-05-20 DIAGNOSIS — Z6841 Body Mass Index (BMI) 40.0 and over, adult: Secondary | ICD-10-CM | POA: Diagnosis not present

## 2021-05-20 DIAGNOSIS — F4312 Post-traumatic stress disorder, chronic: Secondary | ICD-10-CM | POA: Diagnosis not present

## 2021-05-20 DIAGNOSIS — Z7251 High risk heterosexual behavior: Secondary | ICD-10-CM | POA: Diagnosis not present

## 2021-05-20 DIAGNOSIS — Z131 Encounter for screening for diabetes mellitus: Secondary | ICD-10-CM | POA: Diagnosis not present

## 2021-06-02 DIAGNOSIS — E669 Obesity, unspecified: Secondary | ICD-10-CM | POA: Diagnosis not present

## 2021-06-02 DIAGNOSIS — R635 Abnormal weight gain: Secondary | ICD-10-CM | POA: Diagnosis not present

## 2021-06-02 DIAGNOSIS — R0602 Shortness of breath: Secondary | ICD-10-CM | POA: Diagnosis not present

## 2021-06-02 DIAGNOSIS — Z6841 Body Mass Index (BMI) 40.0 and over, adult: Secondary | ICD-10-CM | POA: Diagnosis not present

## 2021-06-02 DIAGNOSIS — F411 Generalized anxiety disorder: Secondary | ICD-10-CM | POA: Diagnosis not present

## 2021-06-18 DIAGNOSIS — F331 Major depressive disorder, recurrent, moderate: Secondary | ICD-10-CM | POA: Diagnosis not present

## 2021-06-18 DIAGNOSIS — Z6841 Body Mass Index (BMI) 40.0 and over, adult: Secondary | ICD-10-CM | POA: Diagnosis not present

## 2021-06-18 DIAGNOSIS — F4310 Post-traumatic stress disorder, unspecified: Secondary | ICD-10-CM | POA: Diagnosis not present

## 2021-06-18 DIAGNOSIS — F411 Generalized anxiety disorder: Secondary | ICD-10-CM | POA: Diagnosis not present

## 2021-06-22 ENCOUNTER — Emergency Department (HOSPITAL_COMMUNITY)
Admission: EM | Admit: 2021-06-22 | Discharge: 2021-06-23 | Disposition: A | Discharge status: Other Acute Inpatient Hospital | Payer: BC Managed Care – PPO | Source: Home / Self Care | Attending: Emergency Medicine | Admitting: Emergency Medicine

## 2021-06-22 ENCOUNTER — Encounter (HOSPITAL_COMMUNITY): Payer: Self-pay

## 2021-06-22 DIAGNOSIS — F4310 Post-traumatic stress disorder, unspecified: Secondary | ICD-10-CM | POA: Insufficient documentation

## 2021-06-22 DIAGNOSIS — Z20822 Contact with and (suspected) exposure to covid-19: Secondary | ICD-10-CM | POA: Diagnosis not present

## 2021-06-22 DIAGNOSIS — G4700 Insomnia, unspecified: Secondary | ICD-10-CM | POA: Diagnosis not present

## 2021-06-22 DIAGNOSIS — Z046 Encounter for general psychiatric examination, requested by authority: Secondary | ICD-10-CM | POA: Insufficient documentation

## 2021-06-22 DIAGNOSIS — Z7982 Long term (current) use of aspirin: Secondary | ICD-10-CM | POA: Insufficient documentation

## 2021-06-22 DIAGNOSIS — Y900 Blood alcohol level of less than 20 mg/100 ml: Secondary | ICD-10-CM | POA: Insufficient documentation

## 2021-06-22 DIAGNOSIS — Z818 Family history of other mental and behavioral disorders: Secondary | ICD-10-CM | POA: Diagnosis not present

## 2021-06-22 DIAGNOSIS — Z79899 Other long term (current) drug therapy: Secondary | ICD-10-CM | POA: Diagnosis not present

## 2021-06-22 DIAGNOSIS — F322 Major depressive disorder, single episode, severe without psychotic features: Secondary | ICD-10-CM | POA: Insufficient documentation

## 2021-06-22 DIAGNOSIS — I10 Essential (primary) hypertension: Secondary | ICD-10-CM | POA: Diagnosis not present

## 2021-06-22 DIAGNOSIS — F332 Major depressive disorder, recurrent severe without psychotic features: Secondary | ICD-10-CM | POA: Diagnosis not present

## 2021-06-22 DIAGNOSIS — R45851 Suicidal ideations: Secondary | ICD-10-CM | POA: Diagnosis not present

## 2021-06-22 DIAGNOSIS — Z9151 Personal history of suicidal behavior: Secondary | ICD-10-CM | POA: Diagnosis not present

## 2021-06-22 LAB — CBC
HCT: 41 % (ref 36.0–46.0)
Hemoglobin: 13.8 g/dL (ref 12.0–15.0)
MCH: 30.1 pg (ref 26.0–34.0)
MCHC: 33.7 g/dL (ref 30.0–36.0)
MCV: 89.5 fL (ref 80.0–100.0)
Platelets: 304 10*3/uL (ref 150–400)
RBC: 4.58 MIL/uL (ref 3.87–5.11)
RDW: 13.4 % (ref 11.5–15.5)
WBC: 6.8 10*3/uL (ref 4.0–10.5)
nRBC: 0 % (ref 0.0–0.2)

## 2021-06-22 LAB — COMPREHENSIVE METABOLIC PANEL
ALT: 21 U/L (ref 0–44)
AST: 18 U/L (ref 15–41)
Albumin: 4.1 g/dL (ref 3.5–5.0)
Alkaline Phosphatase: 58 U/L (ref 38–126)
Anion gap: 9 (ref 5–15)
BUN: 16 mg/dL (ref 6–20)
CO2: 22 mmol/L (ref 22–32)
Calcium: 9.1 mg/dL (ref 8.9–10.3)
Chloride: 109 mmol/L (ref 98–111)
Creatinine, Ser: 0.71 mg/dL (ref 0.44–1.00)
GFR, Estimated: >60 mL/min (ref >60)
Glucose, Bld: 90 mg/dL (ref 70–99)
Potassium: 3.5 mmol/L (ref 3.5–5.1)
Sodium: 140 mmol/L (ref 135–145)
Total Bilirubin: 0.9 mg/dL (ref 0.3–1.2)
Total Protein: 7.8 g/dL (ref 6.5–8.1)

## 2021-06-22 LAB — I-STAT BETA HCG BLOOD, ED (MC, WL, AP ONLY): I-stat hCG, quantitative: <5 m[IU]/mL (ref <5)

## 2021-06-22 LAB — RAPID URINE DRUG SCREEN, HOSP PERFORMED
Amphetamines: NOT DETECTED
Barbiturates: NOT DETECTED
Benzodiazepines: NOT DETECTED
Cocaine: NOT DETECTED
Opiates: NOT DETECTED
Tetrahydrocannabinol: NOT DETECTED

## 2021-06-22 LAB — SALICYLATE LEVEL: Salicylate Lvl: <7 mg/dL — ABNORMAL LOW (ref 7.0–30.0)

## 2021-06-22 LAB — ETHANOL: Alcohol, Ethyl (B): <10 mg/dL (ref <10)

## 2021-06-22 LAB — ACETAMINOPHEN LEVEL: Acetaminophen (Tylenol), Serum: <10 ug/mL — ABNORMAL LOW (ref 10–30)

## 2021-06-22 MED ORDER — ESCITALOPRAM OXALATE 10 MG PO TABS: 10.0000 mg | ORAL_TABLET | Freq: Every day | ORAL | Status: DC

## 2021-06-22 MED ORDER — TRAZODONE HCL 100 MG PO TABS
100.0000 mg | ORAL_TABLET | Freq: Every day | ORAL | Status: DC
  Administered 2021-06-22: 100 mg via ORAL
  Filled 2021-06-22: qty 1

## 2021-06-22 MED ORDER — LORAZEPAM 0.5 MG PO TABS: 0.5000 mg | ORAL_TABLET | Freq: Every day | ORAL | Status: DC | PRN

## 2021-06-22 MED ORDER — PRAZOSIN HCL 1 MG PO CAPS
1.0000 mg | ORAL_CAPSULE | Freq: Every day | ORAL | Status: DC
  Administered 2021-06-22: 1 mg via ORAL
  Filled 2021-06-22: qty 1

## 2021-06-22 MED ORDER — LORAZEPAM 0.5 MG PO TABS
0.5000 mg | ORAL_TABLET | ORAL | Status: DC | PRN
Start: 2021-06-22 — End: 2021-06-23

## 2021-06-22 MED ORDER — LORAZEPAM 0.5 MG PO TABS
0.5000 mg | ORAL_TABLET | Freq: Once | ORAL | Status: AC
Start: 2021-06-22 — End: 2021-06-22
  Administered 2021-06-22: 0.5 mg via ORAL
  Filled 2021-06-22: qty 1

## 2021-06-22 MED ORDER — ACETAMINOPHEN 325 MG PO TABS
650.0000 mg | ORAL_TABLET | Freq: Once | ORAL | Status: AC
  Administered 2021-06-22: 650 mg via ORAL
  Filled 2021-06-22: qty 2

## 2021-06-22 MED ORDER — ASPIRIN-ACETAMINOPHEN-CAFFEINE 250-250-65 MG PO TABS
1.0000 | ORAL_TABLET | Freq: Four times a day (QID) | ORAL | Status: DC | PRN
  Filled 2021-06-22: qty 1

## 2021-06-22 NOTE — Progress Notes (Signed)
Pt was accept to [facility] PENDING Negative Discharges and COVID-19; Bed Assignment 402-2  Dx Major depressive disorder, single episode, severe without psychosis;   PTSD (post-traumatic stress disorder.   Pt meets inpatient criteria per Charmaine Downs, NP  Attending Physician will be Dr. Janine Limbo.   Report can be called to: Adult unit: [phone]  Pt can arrive after: Morning [facility] AC will coordinate  Care Team notified: [facility] Wynonia Hazard, RN, Vonzella Nipple, RN, Cletis Athens, Wyman Songster, NT, Lindon Romp, NP, Chinwendu Cane [place]. NP, Lynnda Shields, RN, Johnny Guy Sandifer, Shelton Silvas, RN, Lynne Leader, RN, Adalberto Ill, RN.  Nadara Mode, LCSWA 06/22/2021 @ 10:11 PM

## 2021-06-22 NOTE — ED Notes (Signed)
Pt belonging bag on bottom self in the 23-25 hallway c cabinet.
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
## 2021-06-22 NOTE — Progress Notes (Signed)
CSW requested that [facility] Fransico Michael, RN review pt.  Maryjean Ka, MSW, [facility] 06/22/2021 9:26 PM

## 2021-06-22 NOTE — ED Notes (Signed)
Patient placed in [facility] bed I. Patient standing next to bed. Patient was offered something to drink and a warm blanket. Patient accepted both. Patient asked about her belongings. Patient was told by this writer the process and reason behind taking belongings.
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
## 2021-06-22 NOTE — ED Notes (Signed)
Mom took all of patients jewelry home per patients request.

## 2021-06-23 ENCOUNTER — Inpatient Hospital Stay (HOSPITAL_COMMUNITY)
Admission: AD | Admit: 2021-06-23 | Discharge: 2021-06-27 | DRG: 885 | Disposition: A | Discharge status: Home / Self Care | Payer: BC Managed Care – PPO | Source: Intra-hospital | Attending: Psychiatry | Admitting: Psychiatry

## 2021-06-23 ENCOUNTER — Encounter (HOSPITAL_COMMUNITY): Payer: Self-pay | Admitting: Nurse Practitioner

## 2021-06-23 ENCOUNTER — Other Ambulatory Visit: Payer: Self-pay

## 2021-06-23 DIAGNOSIS — F332 Major depressive disorder, recurrent severe without psychotic features: Secondary | ICD-10-CM | POA: Diagnosis present

## 2021-06-23 DIAGNOSIS — Z9151 Personal history of suicidal behavior: Secondary | ICD-10-CM

## 2021-06-23 DIAGNOSIS — F322 Major depressive disorder, single episode, severe without psychotic features: Secondary | ICD-10-CM | POA: Diagnosis not present

## 2021-06-23 DIAGNOSIS — Z79899 Other long term (current) drug therapy: Secondary | ICD-10-CM | POA: Diagnosis not present

## 2021-06-23 DIAGNOSIS — Z20822 Contact with and (suspected) exposure to covid-19: Secondary | ICD-10-CM | POA: Diagnosis present

## 2021-06-23 DIAGNOSIS — F4310 Post-traumatic stress disorder, unspecified: Secondary | ICD-10-CM | POA: Diagnosis present

## 2021-06-23 DIAGNOSIS — Z818 Family history of other mental and behavioral disorders: Secondary | ICD-10-CM

## 2021-06-23 DIAGNOSIS — R45851 Suicidal ideations: Secondary | ICD-10-CM | POA: Diagnosis present

## 2021-06-23 DIAGNOSIS — G4700 Insomnia, unspecified: Secondary | ICD-10-CM | POA: Diagnosis present

## 2021-06-23 LAB — RESP PANEL BY RT-PCR (FLU A&B, COVID) ARPGX2
Influenza A by PCR: NEGATIVE
Influenza B by PCR: NEGATIVE
SARS Coronavirus 2 by RT PCR: NEGATIVE

## 2021-06-23 MED ORDER — TRAZODONE HCL 50 MG PO TABS: 50.0000 mg | ORAL_TABLET | Freq: Every evening | ORAL | Status: DC | PRN

## 2021-06-23 MED ORDER — ESCITALOPRAM OXALATE 10 MG PO TABS
10.0000 mg | ORAL_TABLET | Freq: Every day | ORAL | Status: DC
  Administered 2021-06-23 – 2021-06-27 (×5): 10 mg via ORAL
  Filled 2021-06-23 (×3): qty 1
  Filled 2021-06-23: qty 7
  Filled 2021-06-23 (×5): qty 1

## 2021-06-23 MED ORDER — ACETAMINOPHEN 325 MG PO TABS: 650.0000 mg | ORAL_TABLET | Freq: Four times a day (QID) | ORAL | Status: DC | PRN

## 2021-06-23 MED ORDER — HYDROXYZINE HCL 25 MG PO TABS
25.0000 mg | ORAL_TABLET | Freq: Three times a day (TID) | ORAL | Status: DC | PRN
  Administered 2021-06-24 – 2021-06-26 (×3): 25 mg via ORAL
  Filled 2021-06-23: qty 10
  Filled 2021-06-23 (×4): qty 1

## 2021-06-23 MED ORDER — ALUM & MAG HYDROXIDE-SIMETH 200-200-20 MG/5ML PO SUSP: 30.0000 mL | ORAL | Status: DC | PRN

## 2021-06-23 MED ORDER — PRAZOSIN HCL 1 MG PO CAPS
1.0000 mg | ORAL_CAPSULE | Freq: Every day | ORAL | Status: DC
  Administered 2021-06-23: 1 mg via ORAL
  Filled 2021-06-23 (×3): qty 1

## 2021-06-23 MED ORDER — MAGNESIUM HYDROXIDE 400 MG/5ML PO SUSP
30.0000 mL | Freq: Every day | ORAL | Status: DC | PRN
  Administered 2021-06-25: 30 mL via ORAL
  Filled 2021-06-23: qty 30

## 2021-06-23 MED ORDER — TRAZODONE HCL 100 MG PO TABS
100.0000 mg | ORAL_TABLET | Freq: Every day | ORAL | Status: DC
  Administered 2021-06-23: 100 mg via ORAL
  Filled 2021-06-23 (×3): qty 1

## 2021-06-23 NOTE — Group Note (Signed)
Recreation Therapy Group Note   Group Topic:Animal Assisted Therapy   Group Date: 06/23/2021 Start Time: 1430 End Time: 1515 Facilitators: Caroll Rancher, LRT,CTRS Location: [address]   AAA/T Program Assumption of Risk Form signed by Patient/ or Parent Legal Guardian Yes  Patient understands his/her participation is voluntary Yes   Affect/Mood: Appropriate   Participation Level: Engaged    Clinical Observations/Individualized Feedback:  Patient attended session and interacted appropriately with therapy dog and peers. Patient asked appropriate questions about therapy dog and his training. Patient shared stories about their pets at home with group.     Plan: Continue to engage patient in RT group sessions 2-3x/week.   Caroll Rancher, Antonietta Jewel 06/23/2021 3:54 PM

## 2021-06-23 NOTE — ED Provider Notes (Signed)
Emergency Medicine Observation Re-evaluation Note  Jenna Blair is a 36 y.o. female, seen on rounds today.  Pt initially presented to the ED for complaints of Suicidal Currently, the patient is awake.  Physical Exam  BP 113/61 (BP Location: Left Arm)   Pulse (!) 58   Temp 98.2 F (36.8 C) (Oral)   Resp 18   LMP  (LMP Unknown)   SpO2 100%  Physical Exam HENT:     Head: Normocephalic.  Neurological:     Mental Status: She is alert.  Psychiatric:        Mood and Affect: Mood normal.    ED Course / MDM  EKG:   I have reviewed the labs performed to date as well as medications administered while in observation.  Recent changes in the last 24 hours include nothing.  Plan  Current plan is for inpatient treatment. Jenna Blair is under involuntary commitment.      Virgina Norfolk, DO 06/23/21 [phone]
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
## 2021-06-23 NOTE — ED Provider Notes (Signed)
Patient accepted to behavioral health with Dr. Vicente Masson.  Transferred for good condition.   Virgina Norfolk, DO 06/23/21 [phone]

## 2021-06-23 NOTE — [facility] Group Notes (Signed)
Adult Psychoeducational Group Note  Date:  06/23/2021 Time:  10:17 PM  Group Topic/Focus:  Wrap-Up Group:   The focus of this group is to help patients review their daily goal of treatment and discuss progress on daily workbooks.  Participation Level:  Active  Participation Quality:  Attentive  Affect:  Appropriate  Cognitive:  Alert  Insight: Appropriate  Engagement in Group:  Engaged  Modes of Intervention:  Discussion  Additional Comments:  Patient attended and participated in the Wrap-Up group.  Jearl Klinefelter 06/23/2021, 10:17 PM
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
## 2021-06-24 ENCOUNTER — Encounter (HOSPITAL_COMMUNITY): Payer: Self-pay

## 2021-06-24 LAB — LIPID PANEL
Cholesterol: 171 mg/dL (ref 0–200)
HDL: 36 mg/dL — ABNORMAL LOW (ref >40)
LDL Cholesterol: 112 mg/dL — ABNORMAL HIGH (ref 0–99)
Total CHOL/HDL Ratio: 4.8 RATIO
Triglycerides: 117 mg/dL (ref <150)
VLDL: 23 mg/dL (ref 0–40)

## 2021-06-24 LAB — HEMOGLOBIN A1C
Hgb A1c MFr Bld: 5.7 % — ABNORMAL HIGH (ref 4.8–5.6)
Mean Plasma Glucose: 116.89 mg/dL

## 2021-06-24 LAB — TSH: TSH: 1.76 u[IU]/mL (ref 0.350–4.500)

## 2021-06-24 MED ORDER — TRAZODONE HCL 150 MG PO TABS
150.0000 mg | ORAL_TABLET | Freq: Every day | ORAL | Status: DC
  Administered 2021-06-24: 150 mg via ORAL
  Filled 2021-06-24 (×3): qty 1

## 2021-06-24 NOTE — Plan of Care (Signed)
  Problem: Activity: Goal: Interest or engagement in activities will improve Outcome: Progressing   Problem: Education: Goal: Emotional status will improve Outcome: Not Progressing Goal: Mental status will improve Outcome: Not Progressing   

## 2021-06-24 NOTE — [facility] Group Notes (Signed)
PsychoEducational Group- Patients were educated on mental health dysregulation and identifying signs in which they were not compensating. Patients were given analogy of the brain being like a car, and that regulation requires daily practice. The patients were then asked to share one sign they noticed when they were becoming dysregulated. Pt participated and was appropriate 

## 2021-06-24 NOTE — Progress Notes (Signed)
   06/24/21 0200  Psych Admission Type (Psych Patients Only)  Admission Status Involuntary  Psychosocial Assessment  Patient Complaints Tension;Worthlessness  Eye Contact Brief  Facial Expression Sad  Affect Irritable  Speech Logical/coherent  Interaction Assertive  Motor Activity Slow  Appearance/Hygiene Unremarkable  Behavior Characteristics Cooperative;Anxious  Mood Depressed;Anxious  Thought Process  Coherency WDL  Content WDL  Delusions WDL  Perception WDL  Hallucination None reported or observed  Judgment Impaired  Confusion WDL  Danger to Self  Current suicidal ideation? Denies  Danger to Others  Danger to Others None reported or observed

## 2021-06-24 NOTE — Group Note (Signed)
Recreation Therapy Group Note   Group Topic:Team Building  Group Date: 06/24/2021 Start Time: 0930 End Time: 0956 Facilitators: Caroll Rancher, LRT,CTRS Location: [address] Day[address]   Goal Area(s) Addresses:  Patient will effectively work with peer towards shared goal.  Patient will identify skills used to make activity successful.  Patient will identify how skills used during activity can be applied to reach post d/c goals.   Group Description: [employer]. In teams of 3-4, patients were given 25 small craft pipe cleaners. Using the materials provided, patients were instructed to compete again the opposing team(s) to build the tallest free-standing structure from floor level. The activity was timed; difficulty increased by Clinical research associate as Production designer, theatre/television/film continued.  Systematically resources were removed with additional directions for example, placing one arm behind their back, working in silence, and shape stipulations. LRT facilitated post-activity discussion reviewing team processes and necessary communication skills involved in completion. Patients were encouraged to reflect how the skills utilized, or not utilized, in this activity can be incorporated to positively impact support systems post discharge.   Affect/Mood: N/A   Participation Level: Did not attend    Clinical Observations/Individualized Feedback:     Plan: Continue to engage patient in RT group sessions 2-3x/week.   Caroll Rancher, Antonietta Jewel 06/24/2021 12:27 PM

## 2021-06-24 NOTE — Progress Notes (Signed)
Pt denies SI/HI/AVH.  Pt says "I'm just tired and I didn't sleep well last night.  Pt is guarded on approach but pleasant.  Pt took medications without incident and no adverse reactions are noted.  RN will continue to monitor pt's progress and provide support as needed.
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
## 2021-06-24 NOTE — Progress Notes (Signed)
     06/24/21 2135  Psych Admission Type (Psych Patients Only)  Admission Status Involuntary  Psychosocial Assessment  Patient Complaints Anxiety;Depression  Eye Contact Brief  Facial Expression Sad  Affect Anxious  Speech Logical/coherent  Interaction Assertive  Motor Activity Slow  Appearance/Hygiene Unremarkable  Behavior Characteristics Cooperative;Anxious  Mood Depressed;Anxious  Thought Process  Coherency WDL  Content WDL  Delusions None reported or observed  Perception WDL  Hallucination None reported or observed  Judgment Poor  Confusion WDL  Danger to Self  Current suicidal ideation? Denies  Danger to Others  Danger to Others None reported or observed

## 2021-06-24 NOTE — Group Note (Signed)
LCSW Group Therapy Note   Group Date: 06/24/2021 Start Time: 1300 End Time: 1400   Type of Therapy and Topic:  Group Therapy:  Strengths Exploration   Participation Level: Active  Description of Group: This group allows individuals to explore their strengths, learn to use strengths in new ways to improve well-being. Strengths-based interventions involve identifying strengths, understanding how they are used, and learning new ways to apply them. Individuals will identify their strengths, and then explore their roles in different areas of life (relationships, professional life, and personal fulfillment). Individuals will think about ways in which they currently use their strengths, along with new ways they could begin using them.    Therapeutic Goals Patient will verbalize two of their strengths Patient will identify how their strengths are currently used Patient will identify two new ways to apply their strengths  Patients will create a plan to apply their strengths in their daily lives     Summary of Patient Progress:  The Pt attended group and remained there the entire time.  The Pt accepted the worksheets and participated with the discussion.  The Pt demonstrated understanding of the topic and was able to identify strengths within themselves.    Therapeutic Modalities Cognitive Behavioral Therapy Motivational Interviewing  Aram Beecham, [place] 06/24/2021  1:57 PM
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
## 2021-06-24 NOTE — BH IP Treatment Plan (Signed)
Interdisciplinary Treatment and Diagnostic Plan Update  06/24/2021 Time of Session: 9:30am  Jenna Blair MRN: [id number]  Principal Diagnosis: MDD (major depressive disorder), single episode, severe , no psychosis ([place])  Secondary Diagnoses: Principal Problem:   MDD (major depressive disorder), single episode, severe , no psychosis ([place])   Current Medications:  Current Facility-Administered Medications  Medication Dose Route Frequency Provider Last Rate Last Admin   acetaminophen (TYLENOL) tablet 650 mg  650 mg Oral Q6H PRN Onuoha, Chinwendu V, NP       alum & mag hydroxide-simeth (MAALOX/MYLANTA) 200-200-20 MG/5ML suspension 30 mL  30 mL Oral Q4H PRN Onuoha, Chinwendu V, NP       escitalopram (LEXAPRO) tablet 10 mg  10 mg Oral Daily Onuoha, Chinwendu V, NP   10 mg at 06/24/21 1062   hydrOXYzine (ATARAX) tablet 25 mg  25 mg Oral TID PRN Onuoha, Chinwendu V, NP   25 mg at 06/24/21 6948   magnesium hydroxide (MILK OF MAGNESIA) suspension 30 mL  30 mL Oral Daily PRN Onuoha, Chinwendu V, NP       prazosin (MINIPRESS) capsule 1 mg  1 mg Oral QHS Onuoha, Chinwendu V, NP   1 mg at 06/23/21 2107   traZODone (DESYREL) tablet 100 mg  100 mg Oral QHS Onuoha, Chinwendu V, NP   100 mg at 06/23/21 2107   traZODone (DESYREL) tablet 50 mg  50 mg Oral QHS PRN Onuoha, Chinwendu V, NP       PTA Medications: Facility-Administered Medications Prior to Admission  Medication Dose Route Frequency Provider Last Rate Last Admin   cefTRIAXone (ROCEPHIN) injection 500 mg  500 mg Intramuscular Once Shelly Bombard, MD       Medications Prior to Admission  Medication Sig Dispense Refill Last Dose   aspirin-acetaminophen-caffeine (EXCEDRIN MIGRAINE) 250-250-65 MG tablet Take 1 tablet by mouth every 6 (six) hours as needed for headache or migraine.      escitalopram (LEXAPRO) 10 MG tablet Take 10 mg by mouth daily.      escitalopram (LEXAPRO) 5 MG tablet TAKE 1 TABLET (5 MG TOTAL) BY MOUTH DAILY. (Patient not  taking: Reported on 06/22/2021) 90 tablet 4    etonogestrel (NEXPLANON) 68 MG IMPL implant 1 each by Subdermal route once.      LORazepam (ATIVAN) 0.5 MG tablet Take 0.5 mg by mouth daily as needed for anxiety.      metFORMIN (GLUCOPHAGE) 500 MG tablet Take 2 tablets (1,000 mg total) by mouth 2 (two) times daily with a meal. (Patient not taking: Reported on 06/22/2021) 120 tablet 11    phentermine (ADIPEX-P) 37.5 MG tablet Take 37.5 mg by mouth daily.      traZODone (DESYREL) 50 MG tablet Take 100 mg by mouth at bedtime as needed for sleep.       Patient Stressors: Marital or family conflict   Traumatic event    Patient Strengths: Average or above average intelligence  Communication skills   Treatment Modalities: Medication Management, Group therapy, Case management,  1 to 1 session with clinician, Psychoeducation, Recreational therapy.   Physician Treatment Plan for Primary Diagnosis: MDD (major depressive disorder), single episode, severe , no psychosis ([place]) Long Term Goal(s): Improvement in symptoms so as ready for discharge   Short Term Goals: Ability to identify changes in lifestyle to reduce recurrence of condition will improve Ability to verbalize feelings will improve Ability to disclose and discuss suicidal ideas Ability to demonstrate self-control will improve Ability to identify and develop effective coping  behaviors will improve Ability to maintain clinical measurements within normal limits will improve Compliance with prescribed medications will improve Ability to identify triggers associated with substance abuse/mental health issues will improve  Medication Management: Evaluate patient's response, side effects, and tolerance of medication regimen.  Therapeutic Interventions: 1 to 1 sessions, Unit Group sessions and Medication administration.  Evaluation of Outcomes: Not Met  Physician Treatment Plan for Secondary Diagnosis: Principal Problem:   MDD (major depressive  disorder), single episode, severe , no psychosis ([place])  Long Term Goal(s): Improvement in symptoms so as ready for discharge   Short Term Goals: Ability to identify changes in lifestyle to reduce recurrence of condition will improve Ability to verbalize feelings will improve Ability to disclose and discuss suicidal ideas Ability to demonstrate self-control will improve Ability to identify and develop effective coping behaviors will improve Ability to maintain clinical measurements within normal limits will improve Compliance with prescribed medications will improve Ability to identify triggers associated with substance abuse/mental health issues will improve     Medication Management: Evaluate patient's response, side effects, and tolerance of medication regimen.  Therapeutic Interventions: 1 to 1 sessions, Unit Group sessions and Medication administration.  Evaluation of Outcomes: Not Met   RN Treatment Plan for Primary Diagnosis: MDD (major depressive disorder), single episode, severe , no psychosis ([place]) Long Term Goal(s): Knowledge of disease and therapeutic regimen to maintain health will improve  Short Term Goals: Ability to remain free from injury will improve, Ability to participate in decision making will improve, Ability to verbalize feelings will improve, Ability to disclose and discuss suicidal ideas, and Ability to identify and develop effective coping behaviors will improve  Medication Management: RN will administer medications as ordered by provider, will assess and evaluate patient's response and provide education to patient for prescribed medication. RN will report any adverse and/or side effects to prescribing provider.  Therapeutic Interventions: 1 on 1 counseling sessions, Psychoeducation, Medication administration, Evaluate responses to treatment, Monitor vital signs and CBGs as ordered, Perform/monitor CIWA, COWS, AIMS and Fall Risk screenings as ordered, Perform  wound care treatments as ordered.  Evaluation of Outcomes: Not Met   LCSW Treatment Plan for Primary Diagnosis: MDD (major depressive disorder), single episode, severe , no psychosis ([place]) Long Term Goal(s): Safe transition to appropriate next level of care at discharge, Engage patient in therapeutic group addressing interpersonal concerns.  Short Term Goals: Engage patient in aftercare planning with referrals and resources, Increase social support, Increase emotional regulation, Facilitate acceptance of mental health diagnosis and concerns, Identify triggers associated with mental health/substance abuse issues, and Increase skills for wellness and recovery  Therapeutic Interventions: Assess for all discharge needs, 1 to 1 time with Social worker, Explore available resources and support systems, Assess for adequacy in community support network, Educate family and significant other(s) on suicide prevention, Complete Psychosocial Assessment, Interpersonal group therapy.  Evaluation of Outcomes: Not Met   Progress in Treatment: Attending groups: No. Participating in groups: No. Taking medication as prescribed: Yes. Toleration medication: Yes. Family/Significant other contact made: Yes, individual(s) contacted:  Cousin and Boyfriend's Mother Patient understands diagnosis: Yes. Discussing patient identified problems/goals with staff: Yes. Medical problems stabilized or resolved: Yes. Denies suicidal/homicidal ideation: Yes. Issues/concerns per patient self-inventory: No.   New problem(s) identified: No, Describe:  None   New Short Term/Long Term Goal(s): medication stabilization, elimination of SI thoughts, development of comprehensive mental wellness plan.   Patient Goals: "To get better"   Discharge Plan or Barriers: Patient recently admitted. CSW will  continue to follow and assess for appropriate referrals and possible discharge planning.   Reason for Continuation of  Hospitalization: Anxiety Depression Medication stabilization Suicidal ideation  Estimated Length of Stay: 3 to 7 days   Last 3 [place] Suicide Severity Risk Score: [place] Admission (Current) from 06/23/2021 in [facility] ED from 06/22/2021 in [facility] ED from 05/19/2020 in [facility] [place] No Risk No Risk No Risk       Last PHQ 2/9 Scores:    07/22/2014    9:59 AM  Depression screen PHQ 2/9  Decreased Interest 0  Down, Depressed, Hopeless 0  PHQ - 2 Score 0    Scribe for Treatment Team: Darleen Crocker, Latanya Presser 06/24/2021 11:14 AM

## 2021-06-25 MED ORDER — MELATONIN 5 MG PO TABS
5.0000 mg | ORAL_TABLET | Freq: Every day | ORAL | Status: DC
Start: 2021-06-25 — End: 2021-06-27
  Administered 2021-06-25 – 2021-06-26 (×2): 5 mg via ORAL
  Filled 2021-06-25: qty 7
  Filled 2021-06-25 (×5): qty 1

## 2021-06-25 NOTE — [facility] Suicide Risk Assessment (Signed)
[facility] INPATIENT:  Family/Significant Other Suicide Prevention Education  Suicide Prevention Education:  Education Completed; Tovah Slavick [phone] (Cousin) has been identified by the patient as the family member/significant other with whom the patient will be residing, and identified as the person(s) who will aid the patient in the event of a mental health crisis (suicidal ideations/suicide attempt).  With written consent from the patient, the family member/significant other has been provided the following suicide prevention education, prior to the and/or following the discharge of the patient.  The suicide prevention education provided includes the following: Suicide risk factors Suicide prevention and interventions National Suicide Hotline telephone number [facility] assessment telephone number [facility] Emergency Assistance 911 [facility] and/or Residential Mobile Crisis Unit telephone number  Request made of family/significant other to: Remove weapons (e.g., guns, rifles, knives), all items previously/currently identified as safety concern.   Remove drugs/medications (over-the-counter, prescriptions, illicit drugs), all items previously/currently identified as a safety concern.  The family member/significant other verbalizes understanding of the suicide prevention education information provided.  The family member/significant other agrees to remove the items of safety concern listed above.  CSW spoke with Mrs. Agrawal who states that her cousin has been experiencing increased stress, anxiety, and self-doubt.  She states that she speaks with her cousin often and that they are very close.  She states that her cousin has friends that come to her house often to check-in on her and to help her with things.  She states that she will continue to be a support for her after discharge. Mrs. Whittaker states that there are no firearms or weapons in the home.  CSW completed SPE with  Mrs. FoustMetro Kung Margarite Vessel 06/25/2021, 9:51 AM

## 2021-06-25 NOTE — [facility] Counselor (Signed)
CSW provided the Pt with a packet that contains information including shelter and housing resources, free and reduced price food information, clothing resources, crisis center information, a [employer] card, and suicide prevention information.   

## 2021-06-25 NOTE — Progress Notes (Signed)
     06/25/21 0612  Vital Signs  Temp 98.1 F (36.7 C)  Temp Source Oral  Pulse Rate 65  BP (!) 94/53  BP Location Right Arm  BP Method Automatic  Patient Position (if appropriate) Sitting      06/25/21 0614  Vital Signs  Pulse Rate 87  BP (!) 84/52  BP Location Right Arm  BP Method Automatic  Patient Position (if appropriate) Standing  Oxygen Therapy  SpO2 98 %    BP low this AM.  Hydrated pt with 2 cups of Gatorade. Pt is asymptomatic. Pt encouraged to hydrate throughout the day.

## 2021-06-25 NOTE — Plan of Care (Signed)
Nurse discussed coping skills with patient.  
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
## 2021-06-25 NOTE — Progress Notes (Signed)
Adult Psychoeducational Group Note  Date:  06/25/2021 Time:  9:58 PM  Group Topic/Focus:  Wrap-Up Group:   The focus of this group is to help patients review their daily goal of treatment and discuss progress on daily workbooks.  Participation Level:  Active  Participation Quality:  Appropriate  Affect:  Appropriate  Cognitive:  Appropriate  Insight: Appropriate  Engagement in Group:  Engaged  Modes of Intervention:  Discussion  Additional Comments:  Pt goal for the day was to stay humble. Pt met goal.  Tonia Brooms D 06/25/2021, 9:58 PM

## 2021-06-26 NOTE — Progress Notes (Signed)
   06/25/21 2100  Psych Admission Type (Psych Patients Only)  Admission Status Involuntary  Psychosocial Assessment  Patient Complaints Anxiety  Eye Contact Fair  Facial Expression Flat  Affect Anxious  Speech Logical/coherent  Interaction Assertive  Motor Activity Slow  Appearance/Hygiene Unremarkable  Behavior Characteristics Appropriate to situation;Cooperative  Mood Pleasant  Thought Process  Coherency WDL  Content WDL  Delusions None reported or observed  Perception WDL  Hallucination None reported or observed  Judgment Limited  Confusion None  Danger to Self  Current suicidal ideation? Denies  Danger to Others  Danger to Others None reported or observed
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
## 2021-06-26 NOTE — Progress Notes (Signed)
   06/26/21 2000  Psych Admission Type (Psych Patients Only)  Admission Status Involuntary  Psychosocial Assessment  Patient Complaints Irritability  Eye Contact Fair  Facial Expression Flat  Affect Anxious  Speech Logical/coherent  Interaction Assertive  Motor Activity Slow  Appearance/Hygiene Unremarkable  Behavior Characteristics Anxious  Mood Anxious  Thought Process  Coherency WDL  Content WDL  Delusions None reported or observed  Perception WDL  Hallucination None reported or observed  Judgment Limited  Confusion None  Danger to Self  Current suicidal ideation? Denies  Danger to Others  Danger to Others None reported or observed   Compliant with medications no S/S of adverse effects. Q 15 minutes support ongoing. Patient remains safe.

## 2021-06-26 NOTE — Progress Notes (Signed)
Adult Psychoeducational Group Note  Date:  06/26/2021 Time:  10:05 PM  Group Topic/Focus:  Wrap-Up Group:   The focus of this group is to help patients review their daily goal of treatment and discuss progress on daily workbooks.  Participation Level:  Active  Participation Quality:  Appropriate, Attentive, and Sharing  Affect:  Appropriate  Cognitive:  Alert, Appropriate, and Oriented  Insight: Appropriate  Engagement in Group:  Engaged  Modes of Intervention:  Discussion and Support  Additional Comments:  Today pt shared her day was rocky. Pt shared she has been in her room all day. "Im not an inside person". Pt shared she got to hear her daughter voice today over the phone. Pt is happy about boyfriend mom is willing to help her out.   Glorious Peach 06/26/2021, 10:05 PM

## 2021-06-26 NOTE — Group Note (Signed)
[facility] LCSW Group Therapy Note   Group Date: 06/26/2021 Start Time: 1300 End Time: 1400  Type of Therapy and Topic:  Group Therapy:  Feelings around Relapse and Recovery  Participation Level:  Active   Mood: Euthymic   Description of Group:    Patients in this group will discuss emotions they experience before and after a relapse. They will process how experiencing these feelings, or avoidance of experiencing them, relates to having a relapse. Facilitator will guide patients to explore emotions they have related to recovery. Patients will be encouraged to process which emotions are more powerful. They will be guided to discuss the emotional reaction significant others in their lives may have to patients' relapse or recovery. Patients will be assisted in exploring ways to respond to the emotions of others without this contributing to a relapse.  Therapeutic Goals: Patient will identify two or more emotions that lead to relapse for them:  Patient will identify two emotions that result when they relapse:  Patient will identify two emotions related to recovery:  Patient will demonstrate ability to communicate their needs through discussion and/or role plays.   Summary of Patient Progress:  Patient was present for the entirety of the group session. Patient was an active listener and participated in the topic of discussion, provided helpful advice to others, and added nuance to topic of conversation. Patient shared sh enjoys getting new tattoos to cope instead of engaging in alcohol misuse. States that alcohol can become a vice depending on what she is going through at any give time.    Therapeutic Modalities:   Cognitive Behavioral Therapy Solution-Focused Therapy Assertiveness Training Relapse Prevention Therapy   Corky Crafts, [place]

## 2021-06-26 NOTE — Group Note (Signed)
Recreation Therapy Group Note   Group Topic:Stress Management  Group Date: 06/26/2021 Start Time: 0935 End Time: 0955 Facilitators: Caroll Rancher, [place] Location: [address] Dayroom   Goal Area(s) Addresses:  Patient will identify positive stress management techniques. Patient will identify benefits of using stress management post d/c.  Group Description:  Meditation.  LRT played a meditation for patients that focused on finding inner peace for different areas of their lives.  Patients were to listen and focus on the meditation to fully engage in the activity.  Patients were made aware of other places to find stress management techniques such as Youtube, Apps, etc.    Affect/Mood: Appropriate   Participation Level: Engaged   Participation Quality: Independent   Behavior: Appropriate   Speech/Thought Process: Focused   Insight: Good   Judgement: Good   Modes of Intervention: Meditation, Ambient Sounds   Patient Response to Interventions:  Engaged   Education Outcome:  Acknowledges education and In group clarification offered    Clinical Observations/Individualized Feedback: Pt attended and participated in group session.    Plan: Continue to engage patient in RT group sessions 2-3x/week.   Caroll Rancher, LRT,CTRS 06/26/2021 11:11 AM

## 2021-06-26 NOTE — Progress Notes (Signed)
   06/26/21 1200  Psych Admission Type (Psych Patients Only)  Admission Status Involuntary  Psychosocial Assessment  Patient Complaints Irritability  Eye Contact Fair  Facial Expression Flat  Affect Anxious  Speech Logical/coherent  Interaction Defensive;Assertive  Motor Activity Slow  Appearance/Hygiene Unremarkable  Behavior Characteristics Anxious  Mood Anxious  Thought Process  Coherency WDL  Content WDL  Delusions None reported or observed  Perception WDL  Hallucination None reported or observed  Judgment Limited  Confusion None  Danger to Self  Current suicidal ideation? Denies  Danger to Others  Danger to Others None reported or observed

## 2021-06-27 DIAGNOSIS — F322 Major depressive disorder, single episode, severe without psychotic features: Secondary | ICD-10-CM

## 2021-06-27 MED ORDER — HYDROXYZINE HCL 25 MG PO TABS: 25.0000 mg | ORAL_TABLET | Freq: Three times a day (TID) | ORAL | 0 refills | Status: AC | PRN

## 2021-06-27 MED ORDER — MELATONIN 5 MG PO TABS: 5.0000 mg | ORAL_TABLET | Freq: Every day | ORAL | 0 refills | Status: AC

## 2021-06-27 MED ORDER — ESCITALOPRAM OXALATE 10 MG PO TABS: 10.0000 mg | ORAL_TABLET | Freq: Every day | ORAL | 0 refills | Status: AC
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
## 2021-06-27 NOTE — Progress Notes (Signed)
DISCHARGE NOTE: All possessions/property returned to patient and signature obtained. Verbal and written education provided to patient regarding follow up care, treatment plan and discharge medications. Pt verbalized comprehension. Pt currently denies suicidal ideations, denies homicidal ideations, denies visual hallucination, and denies auditory hallucinations. No c/o pain and/or discomfort. All precautions discontinued by physician. Pt remains calm, cooperative, medication-compliant, ambulatory and functioning at Pt's optimal level. There are no indications that this Pt is engaged in self-directed violent behavior, either preparatory or potentially harmful.  

## 2021-06-27 NOTE — Progress Notes (Signed)
  [facility] Adult Case Management Discharge Plan :  Will you be returning to the same living situation after discharge:  Yes,  home At discharge, do you have transportation home?: Yes,  cousin Do you have the ability to pay for your medications: Yes,  insurance  Release of information consent forms completed and emailed to Medical Records, then turned in to Medical Records by CSW.   Patient to Follow up at:  Follow-up Information     [facility]. Go on 07/17/2021.   Why: You have an appointment for medication management services on 07/17/21 at 10:45 am.  This appointment will be held in person. Contact information: [address] [place], [place] [address]  Ph: [phone]        [facility]. Go on 06/30/2021.   Why: You have an appointment for therapy services with Marchelle Folks, on 06/30/21 at 11:00 am.  This appointment will be held in person at the new location:  [address], [place], [place]. Contact information: [address] [place], [place] [address]  Phone: [phone] Fax: [phone]                Next level of care provider has access to [facility] Link:no  Safety Planning and Suicide Prevention discussed: Yes,  cousin     Has patient been referred to the Quitline?: N/A patient is not a smoker  Patient has been referred for addiction treatment: N/A  Lynnell Chad, LCSW 06/27/2021, 11:39 AM

## 2021-06-27 NOTE — [facility] Suicide Risk Assessment (Signed)
[facility]   Principal Problem: MDD (major depressive disorder), single episode, severe , no psychosis (HCC) Discharge Diagnoses: Principal Problem:   MDD (major depressive disorder), single episode, severe , no psychosis (HCC)   Total Time spent with patient: 45 minutes  Reason for admission: worsening depression, suicide guesture  PTA Medications: PTA Medications:                  Facility-Administered Medications Prior to Admission  Medication Dose Route Frequency Provider Last Rate Last Admin   cefTRIAXone (ROCEPHIN) injection 500 mg  500 mg Intramuscular Once Brock BadHarper, Charles A, MD                     Medications Prior to Admission  Medication Sig Dispense Refill Last Dose   aspirin-acetaminophen-caffeine (EXCEDRIN MIGRAINE) 250-250-65 MG tablet Take 1 tablet by mouth every 6 (six) hours as needed for headache or migraine.         escitalopram (LEXAPRO) 10 MG tablet Take 10 mg by mouth daily.         escitalopram (LEXAPRO) 5 MG tablet TAKE 1 TABLET (5 MG TOTAL) BY MOUTH DAILY. (Patient not taking: Reported on 06/22/2021) 90 tablet 4     etonogestrel (NEXPLANON) 68 MG IMPL implant 1 each by Subdermal route once.         LORazepam (ATIVAN) 0.5 MG tablet Take 0.5 mg by mouth daily as needed for anxiety.         metFORMIN (GLUCOPHAGE) 500 MG tablet Take 2 tablets (1,000 mg total) by mouth 2 (two) times daily with a meal. (Patient not taking: Reported on 06/22/2021) 120 tablet 11     phentermine (ADIPEX-P) 37.5 MG tablet Take 37.5 mg by mouth daily.         traZODone (DESYREL) 50 MG tablet Take 100 mg by mouth at bedtime as needed for sleep.           Hospital Course:   During the patient's hospitalization, patient had extensive initial psychiatric evaluation, and follow-up psychiatric evaluations every day.  Psychiatric diagnoses provided upon initial assessment: MDD recurrent severe  Patient's psychiatric medications were adjusted on admission:  1.   Start Lexapro 10 mg daily for depression and anxiety symptoms             2.  Start Atarax 25 mg every 8 hours as needed for anxiety             3.  Started trazodone 50 mg at bedtime as needed for sleep  During the hospitalization, other adjustments were made to the patient's psychiatric medication regimen: 1.  Continue Lexapro 10 mg daily for depression and anxiety symptoms             2.  Continue Atarax 25 mg every 8 hours as needed for anxiety             3.  Continue off trazodone, was stopped for low blood pressure, blood pressure improved.             4.  Continue melatonin at night for sleep and monitored efficacy and safety  Patient's care was discussed during the interdisciplinary team meeting every day during the hospitalization.  The patient denies having side effects to prescribed psychiatric medication.  Gradually, patient started adjusting to milieu. The patient was evaluated each day by a clinical provider to ascertain response to treatment. Improvement was noted by the patient's report of decreasing symptoms, improved sleep and  appetite, affect, medication tolerance, behavior, and participation in unit programming.  Patient was asked each day to complete a self inventory noting mood, mental status, pain, new symptoms, anxiety and concerns.    Symptoms were reported as significantly decreased or resolved completely by discharge.   On day of discharge, the patient reports that their mood is stable. The patient denied having suicidal thoughts for more than 48 hours prior to discharge.  Patient denies having homicidal thoughts.  Patient denies having auditory hallucinations.  Patient denies any visual hallucinations or other symptoms of psychosis. The patient was motivated to continue taking medication with a goal of continued improvement in mental health.   The patient reports their target psychiatric symptoms of worsening depressed mood and SI responded well to the psychiatric  medications, and the patient reports overall benefit other psychiatric hospitalization. Supportive psychotherapy was provided to the patient. The patient also participated in regular group therapy while hospitalized. Coping skills, problem solving as well as relaxation therapies were also part of the unit programming.  Labs were reviewed with the patient, and abnormal results were discussed with the patient.  The patient is able to verbalize their individual safety plan to this provider.  # It is recommended to the patient to continue psychiatric medications as prescribed, after discharge from the hospital.    # It is recommended to the patient to follow up with your outpatient psychiatric provider and PCP.  # It was discussed with the patient, the impact of alcohol, drugs, tobacco have been there overall psychiatric and medical wellbeing, and total abstinence from substance use was recommended the patient.ed.  # Prescriptions provided or sent directly to preferred pharmacy at discharge. Patient agreeable to plan. Given opportunity to ask questions. Appears to feel comfortable with discharge.    # In the event of worsening symptoms, the patient is instructed to call the crisis hotline, 911 and or go to the nearest ED for appropriate evaluation and treatment of symptoms. To follow-up with primary care provider for other medical issues, concerns and or health care needs  # Patient was discharged home with family with a plan to follow up as noted below.   Behavioral Events: none Restraints: none  Groups:attended and complied  Medications Changes: as above  On day of d/c patient presented pleasant and smiling, denies sihi or avh, denies se to meds, agrees to comply with meds and FU appt  D/C Medications: as above   Musculoskeletal: Strength & Muscle Tone: within normal limits Gait & Station: normal Patient leans: N/A  Psychiatric Specialty Exam  Presentation  General Appearance:  Appropriate for Environment; Casual  Eye Contact:Good  Speech:Clear and Coherent; Normal Rate  Speech Volume:Normal  Handedness:Right   Mood and Affect  Mood:Depressed ("trying to sleep")  Duration of Depression Symptoms: No data recorded Affect:Congruent; Depressed   Thought Process  Thought Processes:Coherent; Goal Directed  Descriptions of Associations:Intact  Orientation:Full (Time, Place and Person)  Thought Content:Logical  History of Schizophrenia/Schizoaffective disorder:No data recorded Duration of Psychotic Symptoms:No data recorded Hallucinations:Hallucinations: None  Ideas of Reference:None  Suicidal Thoughts:Suicidal Thoughts: No  Homicidal Thoughts:Homicidal Thoughts: No   Sensorium  Memory:Immediate Good; Recent Good; Remote Good  Judgment:Good  Insight:Fair   Executive Functions  Concentration:Fair  Attention Span:Good  Recall:Good  Fund of Knowledge:Good  Language:Good   Psychomotor Activity  Psychomotor Activity:Psychomotor Activity: Normal   Assets  Assets:Communication Skills; Desire for Improvement; Housing; Resilience; Social Support   Sleep  Sleep:Sleep: Good   Physical Exam: Physical Exam ROS  Blood pressure 96/74, pulse 95, temperature 98.4 F (36.9 C), temperature source Oral, resp. rate 18, height 5' (1.524 m), weight 112.7 kg, SpO2 100 %. Body mass index is 48.51 kg/m.  Mental Status Per Nursing Assessment::   On Admission:  NA  Demographic Factors:  NA  Loss Factors: NA  Historical Factors: Prior suicide attempts  Risk Reduction Factors:   Living with another person, especially a relative and Positive social support  Continued Clinical Symptoms:  Depression:   Anhedonia Insomnia improved  Cognitive Features That Contribute To Risk:  Closed-mindedness    Suicide Risk:  Minimal: No identifiable suicidal ideation.  Patients presenting with no risk factors but with morbid ruminations; may be  classified as minimal risk based on the severity of the depressive symptoms   Follow-up Information     [facility]. Go on 07/17/2021.   Why: You have an appointment for medication management services on 07/17/21 at 10:45 am.  This appointment will be held in person. Contact information: [address] [place], [place] [address]  Ph: [phone]        [facility] [employer]. Go on 06/30/2021.   Why: You have an appointment for therapy services with Marchelle Folks, on 06/30/21 at 11:00 am.  This appointment will be held in person at the new location:  [address], [place], [place]. Contact information: [address] [place], [place] [address]  Phone: [phone] Fax: [phone]                Plan Of Care/Follow-up recommendations:   Patient was recommended to comply with Follow up appoitment and medications after discharge. D/w potential Se of medications. D/W patient safety plan, recommendations to call crisis line or 911 or return to ER if recurring SI/HI Patient agrees with D/C instructions and plan.    The patient received suicide prevention pamphlet:  Yes Belongings returned:  Valuables  Total Time Spent in Direct Patient Care:  I personally spent 45 minutes on the unit in direct patient care. The direct patient care time included face-to-face time with the patient, reviewing the patient's chart, communicating with other professionals, and coordinating care. Greater than 50% of this time was spent in counseling or coordinating care with the patient regarding goals of hospitalization, psycho-education, and discharge planning needs.   Charlottie Peragine 06/27/2021, 11:09 AM   Demaree Liberto Abbott Pao, MD 06/27/2021, 11:09 AM

## 2021-06-30 DIAGNOSIS — F4312 Post-traumatic stress disorder, chronic: Secondary | ICD-10-CM | POA: Diagnosis not present

## 2021-07-02 DIAGNOSIS — F411 Generalized anxiety disorder: Secondary | ICD-10-CM | POA: Diagnosis not present

## 2021-07-02 DIAGNOSIS — Z6841 Body Mass Index (BMI) 40.0 and over, adult: Secondary | ICD-10-CM | POA: Diagnosis not present

## 2021-07-02 DIAGNOSIS — R635 Abnormal weight gain: Secondary | ICD-10-CM | POA: Diagnosis not present

## 2021-07-02 DIAGNOSIS — E669 Obesity, unspecified: Secondary | ICD-10-CM | POA: Diagnosis not present

## 2021-07-04 DIAGNOSIS — F4312 Post-traumatic stress disorder, chronic: Secondary | ICD-10-CM | POA: Diagnosis not present

## 2021-07-17 DIAGNOSIS — Z6841 Body Mass Index (BMI) 40.0 and over, adult: Secondary | ICD-10-CM | POA: Diagnosis not present

## 2021-07-17 DIAGNOSIS — Z79899 Other long term (current) drug therapy: Secondary | ICD-10-CM | POA: Diagnosis not present

## 2021-07-17 DIAGNOSIS — F411 Generalized anxiety disorder: Secondary | ICD-10-CM | POA: Diagnosis not present

## 2021-07-17 DIAGNOSIS — R030 Elevated blood-pressure reading, without diagnosis of hypertension: Secondary | ICD-10-CM | POA: Diagnosis not present

## 2021-07-17 DIAGNOSIS — F4310 Post-traumatic stress disorder, unspecified: Secondary | ICD-10-CM | POA: Diagnosis not present

## 2021-07-17 DIAGNOSIS — F331 Major depressive disorder, recurrent, moderate: Secondary | ICD-10-CM | POA: Diagnosis not present

## 2021-07-21 DIAGNOSIS — Z79899 Other long term (current) drug therapy: Secondary | ICD-10-CM | POA: Diagnosis not present

## 2021-07-31 DIAGNOSIS — E669 Obesity, unspecified: Secondary | ICD-10-CM | POA: Diagnosis not present

## 2021-07-31 DIAGNOSIS — R030 Elevated blood-pressure reading, without diagnosis of hypertension: Secondary | ICD-10-CM | POA: Diagnosis not present

## 2021-07-31 DIAGNOSIS — Z6841 Body Mass Index (BMI) 40.0 and over, adult: Secondary | ICD-10-CM | POA: Diagnosis not present

## 2021-08-27 DIAGNOSIS — F4312 Post-traumatic stress disorder, chronic: Secondary | ICD-10-CM | POA: Diagnosis not present

## 2021-08-29 DIAGNOSIS — E669 Obesity, unspecified: Secondary | ICD-10-CM | POA: Diagnosis not present

## 2021-08-29 DIAGNOSIS — F4310 Post-traumatic stress disorder, unspecified: Secondary | ICD-10-CM | POA: Diagnosis not present

## 2021-08-29 DIAGNOSIS — Z79899 Other long term (current) drug therapy: Secondary | ICD-10-CM | POA: Diagnosis not present

## 2021-08-29 DIAGNOSIS — F411 Generalized anxiety disorder: Secondary | ICD-10-CM | POA: Diagnosis not present

## 2021-08-29 DIAGNOSIS — Z6841 Body Mass Index (BMI) 40.0 and over, adult: Secondary | ICD-10-CM | POA: Diagnosis not present

## 2021-08-29 DIAGNOSIS — F331 Major depressive disorder, recurrent, moderate: Secondary | ICD-10-CM | POA: Diagnosis not present

## 2021-08-30 ENCOUNTER — Encounter (HOSPITAL_COMMUNITY): Payer: Self-pay | Admitting: Emergency Medicine

## 2021-08-30 ENCOUNTER — Ambulatory Visit (HOSPITAL_COMMUNITY)
Admission: EM | Admit: 2021-08-30 | Discharge: 2021-08-30 | Disposition: A | Discharge status: Home / Self Care | Payer: BC Managed Care – PPO | Attending: Emergency Medicine | Admitting: Emergency Medicine

## 2021-08-30 DIAGNOSIS — H1031 Unspecified acute conjunctivitis, right eye: Secondary | ICD-10-CM | POA: Diagnosis not present

## 2021-08-30 MED ORDER — GENTAMICIN SULFATE 0.3 % OP SOLN: 1.0000 [drp] | OPHTHALMIC | 0 refills | Status: AC

## 2021-08-30 NOTE — ED Provider Notes (Signed)
[facility]    CSN: [id number] Arrival date & time: 08/30/21  1003      History   Chief Complaint Chief Complaint  Patient presents with   Conjunctivitis    HPI Jenna Blair is a 36 y.o. female.  Presents with 1 day history of right eye redness, drainage. Reports she woke up this morning and her eye was matted shut with some cloudy green drainage.  Irritation but no pain.  Denies any pain with eye movement. No fever, chills, congestion, sore throat, cough. Tried Visine red-eye drops Does not wear contacts  Past Medical History:  Diagnosis Date   Depression    Environmental allergies    Pre-diabetes    Sleep apnea    Uses C Pap nightly    Patient Active Problem List   Diagnosis Date Noted   MDD (major depressive disorder), single episode, severe , no psychosis (HCC) 06/23/2021   Major depressive disorder, single episode, severe without psychosis (HCC) 06/22/2021   PTSD (post-traumatic stress disorder) 06/22/2021   Sleep apnea 04/20/2017   Bleeding internal hemorrhoids 01/07/2017   Morbid obesity (HCC) 12/03/2014   OSA (obstructive sleep apnea) 10/22/2014    Past Surgical History:  Procedure Laterality Date   CESAREAN SECTION     x1   LEG SURGERY Right    Injury as a child   TONSILLECTOMY/ADENOIDECTOMY/TURBINATE REDUCTION Bilateral 04/20/2017   Procedure: TONSILLECTOMY/ADENOIDECTOMY AND BILATERAL TURBINATE REDUCTION;  Surgeon: Flo Shanks, MD;  Location: [facility] OR;  Service: ENT;  Laterality: Bilateral;   TOOTH EXTRACTION      OB History     Gravida  1   Para      Term      Preterm      AB      Living  1      SAB      IAB      Ectopic      Multiple      Live Births  1            Home Medications    Prior to Admission medications   Medication Sig Start Date End Date Taking? Authorizing Provider  gentamicin (GARAMYCIN) 0.3 % ophthalmic solution Place 1 drop into the right eye every 4 (four) hours for 5 days. 08/30/21  09/04/21 Yes Burton Gahan, Lurena Joiner, PA-C  escitalopram (LEXAPRO) 10 MG tablet Take 1 tablet (10 mg total) by mouth daily. 06/28/21   Sarita Bottom, MD  hydrOXYzine (ATARAX) 25 MG tablet Take 1 tablet (25 mg total) by mouth 3 (three) times daily as needed for anxiety. 06/27/21   Sarita Bottom, MD  melatonin 5 MG TABS Take 1 tablet (5 mg total) by mouth at bedtime. 06/27/21   Sarita Bottom, MD  cetirizine (ZYRTEC) 10 MG tablet Take 1 tablet (10 mg total) by mouth 2 (two) times daily. Patient not taking: Reported on 08/03/2018 02/28/18 02/29/20  Eustace Moore, MD  ipratropium (ATROVENT) 0.03 % nasal spray Place 2 sprays into both nostrils every 12 (twelve) hours. Patient not taking: Reported on 08/06/2019  02/29/20  [provider]  loratadine (CLARITIN) 10 MG tablet Take 1 tablet (10 mg total) by mouth daily. Patient not taking: Reported on 08/06/2019 08/03/18 02/29/20  Brock Bad, MD    Family History Family History  Problem Relation Age of Onset   Diabetes Other    Heart disease Other    Hyperlipidemia Other    Hypertension Other    Stroke Other    Thyroid  disease Other    Diabetes Maternal Aunt    Diabetes Paternal Aunt     Social History Social History   Tobacco Use   Smoking status: Never    Passive exposure: Never   Smokeless tobacco: Never  Vaping Use   Vaping Use: Never used  Substance Use Topics   Alcohol use: Yes    Comment: occasional   Drug use: No     Allergies   Patient has no known allergies.   Review of Systems Review of Systems  Per HPI  Physical Exam Triage Vital Signs ED Triage Vitals  Enc Vitals Group     BP 08/30/21 1018 133/84     Pulse Rate 08/30/21 1018 78     Resp 08/30/21 1018 17     Temp 08/30/21 1018 98.3 F (36.8 C)     Temp Source 08/30/21 1018 Oral     SpO2 08/30/21 1018 97 %     Weight --      Height --      Head Circumference --      Peak Flow --      Pain Score 08/30/21 1015 9     Pain Loc --      Pain Edu? --      Excl. in  GC? --    No data found.  Updated Vital Signs BP 133/84 (BP Location: Right Arm)   Pulse 78   Temp 98.3 F (36.8 C) (Oral)   Resp 17   SpO2 97%   Visual Acuity Right Eye Distance: 20/15 (corrected with glasses) Left Eye Distance: 20/15 (corrected with glasses) Bilateral Distance: 20/15 (corrected with glasses)   Physical Exam Vitals and nursing note reviewed.  Constitutional:      Appearance: Normal appearance.  HENT:     Mouth/Throat:     Mouth: Mucous membranes are moist.     Pharynx: Oropharynx is clear. No posterior oropharyngeal erythema.  Eyes:     General: Lids are normal. Vision grossly intact.     Extraocular Movements: Extraocular movements intact.     Conjunctiva/sclera:     Right eye: Right conjunctiva is injected.     Pupils: Pupils are equal, round, and reactive to light.  Cardiovascular:     Rate and Rhythm: Normal rate and regular rhythm.     Pulses: Normal pulses.     Heart sounds: Normal heart sounds.  Pulmonary:     Effort: Pulmonary effort is normal.     Breath sounds: Normal breath sounds.  Skin:    General: Skin is warm and dry.  Neurological:     Mental Status: She is alert and oriented to person, place, and time.     UC Treatments / Results  Labs (all labs ordered are listed, but only abnormal results are displayed) Labs Reviewed - No data to display  EKG   Radiology No results found.  Procedures Procedures (including critical care time)  Medications Ordered in UC Medications - No data to display  Initial Impression / Assessment and Plan / UC Course  I have reviewed the triage vital signs and the nursing notes.  Pertinent labs & imaging results that were available during my care of the patient were reviewed by me and considered in my medical decision making (see chart for details).  Exam consistent with conjunctivitis.  Try gentamicin drops every 4 hours for the next few days.  She can use the drops in her left eye if she  develops symptoms.  Follow-up  with primary care provider symptoms persist.  Emergency department precautions.  Patient agrees with plan.  Final Clinical Impressions(s) / UC Diagnoses   Final diagnoses:  Acute conjunctivitis of right eye, unspecified acute conjunctivitis type     Discharge Instructions      Please use the drops as prescribed in addition to warm compress.  Please follow up with your primary care provider if symptoms persist.    ED Prescriptions     Medication Sig Dispense Auth. Provider   gentamicin (GARAMYCIN) 0.3 % ophthalmic solution Place 1 drop into the right eye every 4 (four) hours for 5 days. 5 mL Fletcher Rathbun, Lurena Joiner, PA-C      PDMP not reviewed this encounter.   Torrance Frech, Lurena Joiner, [place] 08/30/21 1036

## 2021-08-30 NOTE — Discharge Instructions (Addendum)
Please use the drops as prescribed in addition to warm compress.  Please follow up with your primary care provider if symptoms persist.

## 2021-08-30 NOTE — ED Triage Notes (Signed)
Pt reports right eye redness, drainage and swelling since yesterday. States have tried the Visine eye drops for relief.

## 2021-09-02 DIAGNOSIS — Z79899 Other long term (current) drug therapy: Secondary | ICD-10-CM | POA: Diagnosis not present

## 2021-09-05 DIAGNOSIS — R635 Abnormal weight gain: Secondary | ICD-10-CM | POA: Diagnosis not present

## 2021-09-05 DIAGNOSIS — R7303 Prediabetes: Secondary | ICD-10-CM | POA: Diagnosis not present

## 2021-09-05 DIAGNOSIS — R030 Elevated blood-pressure reading, without diagnosis of hypertension: Secondary | ICD-10-CM | POA: Diagnosis not present

## 2021-09-05 DIAGNOSIS — Z6841 Body Mass Index (BMI) 40.0 and over, adult: Secondary | ICD-10-CM | POA: Diagnosis not present

## 2021-09-12 DIAGNOSIS — F4312 Post-traumatic stress disorder, chronic: Secondary | ICD-10-CM | POA: Diagnosis not present

## 2021-09-26 DIAGNOSIS — F4312 Post-traumatic stress disorder, chronic: Secondary | ICD-10-CM | POA: Diagnosis not present

## 2021-10-05 DIAGNOSIS — Z6841 Body Mass Index (BMI) 40.0 and over, adult: Secondary | ICD-10-CM | POA: Diagnosis not present

## 2021-10-05 DIAGNOSIS — R030 Elevated blood-pressure reading, without diagnosis of hypertension: Secondary | ICD-10-CM | POA: Diagnosis not present

## 2021-10-05 DIAGNOSIS — E669 Obesity, unspecified: Secondary | ICD-10-CM | POA: Diagnosis not present

## 2021-10-05 DIAGNOSIS — R635 Abnormal weight gain: Secondary | ICD-10-CM | POA: Diagnosis not present

## 2021-10-09 DIAGNOSIS — F4310 Post-traumatic stress disorder, unspecified: Secondary | ICD-10-CM | POA: Diagnosis not present

## 2021-10-09 DIAGNOSIS — F411 Generalized anxiety disorder: Secondary | ICD-10-CM | POA: Diagnosis not present

## 2021-10-09 DIAGNOSIS — Z79899 Other long term (current) drug therapy: Secondary | ICD-10-CM | POA: Diagnosis not present

## 2021-10-09 DIAGNOSIS — Z6841 Body Mass Index (BMI) 40.0 and over, adult: Secondary | ICD-10-CM | POA: Diagnosis not present

## 2021-10-09 DIAGNOSIS — F331 Major depressive disorder, recurrent, moderate: Secondary | ICD-10-CM | POA: Diagnosis not present

## 2021-10-13 DIAGNOSIS — Z79899 Other long term (current) drug therapy: Secondary | ICD-10-CM | POA: Diagnosis not present

## 2021-10-21 ENCOUNTER — Encounter (HOSPITAL_COMMUNITY): Payer: Self-pay

## 2021-10-21 ENCOUNTER — Ambulatory Visit (HOSPITAL_COMMUNITY)
Admission: EM | Admit: 2021-10-21 | Discharge: 2021-10-21 | Disposition: A | Discharge status: Home / Self Care | Payer: BC Managed Care – PPO | Attending: Family Medicine | Admitting: Family Medicine

## 2021-10-21 DIAGNOSIS — S90862A Insect bite (nonvenomous), left foot, initial encounter: Secondary | ICD-10-CM

## 2021-10-21 DIAGNOSIS — M7989 Other specified soft tissue disorders: Secondary | ICD-10-CM | POA: Diagnosis not present

## 2021-10-21 DIAGNOSIS — W57XXXA Bitten or stung by nonvenomous insect and other nonvenomous arthropods, initial encounter: Secondary | ICD-10-CM

## 2021-10-21 DIAGNOSIS — S90861A Insect bite (nonvenomous), right foot, initial encounter: Secondary | ICD-10-CM | POA: Diagnosis not present

## 2021-10-21 DIAGNOSIS — L299 Pruritus, unspecified: Secondary | ICD-10-CM

## 2021-10-21 DIAGNOSIS — S90869A Insect bite (nonvenomous), unspecified foot, initial encounter: Secondary | ICD-10-CM

## 2021-10-21 MED ORDER — PREDNISONE 20 MG PO TABS: 40.0000 mg | ORAL_TABLET | Freq: Every day | ORAL | 0 refills | Status: DC

## 2021-10-21 NOTE — ED Triage Notes (Signed)
Pt c/o feet throbbing, swelling, itching, and hard to touch. States thinks she was bite by a bug. States allergic to mosquitoes. Took ibuprofen 2-3 hrs ago.

## 2021-10-22 NOTE — ED Provider Notes (Signed)
Jenna Blair   [id number] 10/21/21 Arrival Time: 5009  ASSESSMENT & PLAN:  1. Bilateral swelling of feet   2. Itching   3. Insect bite of foot, unspecified laterality, initial encounter    No specific pitting edema; only feet involved with apparent insect bites. Will tx as allergic in nature.  Begin: Meds ordered this encounter  Medications   predniSONE (DELTASONE) 20 MG tablet    Sig: Take 2 tablets (40 mg total) by mouth daily.    Dispense:  10 tablet    Refill:  0    Follow-up Information     [facility].   Specialty: Family Medicine Why: As needed. Contact information: [address] [address] [facility].   Specialty: Urgent Care Why: If worsening or failing to improve as anticipated. Contact information: [place] [address] [phone]                Reviewed expectations re: course of current medical issues. Questions answered. Outlined signs and symptoms indicating need for more acute intervention. Patient verbalized understanding. After Visit Summary given.   SUBJECTIVE:  History from: patient. Jenna Blair is a 36 y.o. female who c/o feet throbbing, swelling, itching, and hard to touch; today. States thinks she was bite by a bug. States allergic to mosquitoes. Took ibuprofen 2-3 hrs ago. Ambulatory but feet hurt. No h/o similar. No extremity sensation changes or weakness.   Social History   Tobacco Use  Smoking Status Never   Passive exposure: Never  Smokeless Tobacco Never   Social History   Substance and Sexual Activity  Alcohol Use Yes   Comment: occasional   OBJECTIVE:  Vitals:   10/21/21 1634  BP: 133/88  Pulse: 71  Resp: 18  Temp: 98.5 F (36.9 C)  TempSrc: Oral  SpO2: 97%    General appearance: alert, oriented, no acute distress Eyes: PERRLA; EOMI; conjunctivae  normal HENT: normocephalic; atraumatic Extremities: without edema but both feet are swollen with slight diffuse erythema; each foot with a few sterile blisters consistent with insect/ant bites; without calf swelling or tenderness Skin: warm and dry; no signs of infection Neuro: normal gait Psychological: alert and cooperative; normal mood and affect   No Known Allergies  Past Medical History:  Diagnosis Date   Depression    Environmental allergies    Pre-diabetes    Sleep apnea    Uses C Pap nightly   Social History   Socioeconomic History   Marital status: Single    Spouse name: Not on file   Number of children: 1   Years of education: Not on file   Highest education level: Not on file  Occupational History   Not on file  Tobacco Use   Smoking status: Never    Passive exposure: Never   Smokeless tobacco: Never  Vaping Use   Vaping Use: Never used  Substance and Sexual Activity   Alcohol use: Yes    Comment: occasional   Drug use: No   Sexual activity: Yes    Partners: Male    Birth control/protection: Implant  Other Topics Concern   Not on file  Social History Narrative   Not on file   Social Determinants of Health   Financial Resource Strain: Not on file  Food Insecurity: Not on file  Transportation Needs: Not on file  Physical Activity: Not on file  Stress: Not on file  Social Connections: Not on file  Intimate Partner Violence: Not on file   Family History  Problem Relation Age of Onset   Diabetes Other    Heart disease Other    Hyperlipidemia Other    Hypertension Other    Stroke Other    Thyroid disease Other    Diabetes Maternal Aunt    Diabetes Paternal Aunt    Past Surgical History:  Procedure Laterality Date   CESAREAN SECTION     x1   LEG SURGERY Right    Injury as a child   TONSILLECTOMY/ADENOIDECTOMY/TURBINATE REDUCTION Bilateral 04/20/2017   Procedure: TONSILLECTOMY/ADENOIDECTOMY AND BILATERAL TURBINATE REDUCTION;  Surgeon:  Flo Shanks, MD;  Location: [facility] OR;  Service: ENT;  Laterality: Bilateral;   TOOTH EXTRACTION        Mardella Layman, MD 10/22/21 1013

## 2021-11-27 ENCOUNTER — Emergency Department (HOSPITAL_COMMUNITY)
Admission: EM | Admit: 2021-11-27 | Discharge: 2021-11-27 | Disposition: A | Discharge status: Home / Self Care | Payer: BC Managed Care – PPO | Attending: Emergency Medicine | Admitting: Emergency Medicine

## 2021-11-27 ENCOUNTER — Encounter (HOSPITAL_COMMUNITY): Payer: Self-pay | Admitting: Emergency Medicine

## 2021-11-27 ENCOUNTER — Emergency Department (HOSPITAL_COMMUNITY): Payer: BC Managed Care – PPO

## 2021-11-27 ENCOUNTER — Other Ambulatory Visit: Payer: Self-pay

## 2021-11-27 DIAGNOSIS — M545 Low back pain, unspecified: Secondary | ICD-10-CM | POA: Diagnosis present

## 2021-11-27 DIAGNOSIS — Y92410 Unspecified street and highway as the place of occurrence of the external cause: Secondary | ICD-10-CM | POA: Diagnosis not present

## 2021-11-27 DIAGNOSIS — S335XXA Sprain of ligaments of lumbar spine, initial encounter: Secondary | ICD-10-CM | POA: Diagnosis not present

## 2021-11-27 DIAGNOSIS — M79652 Pain in left thigh: Secondary | ICD-10-CM | POA: Insufficient documentation

## 2021-11-27 DIAGNOSIS — S39012A Strain of muscle, fascia and tendon of lower back, initial encounter: Secondary | ICD-10-CM | POA: Insufficient documentation

## 2021-11-27 MED ORDER — KETOROLAC TROMETHAMINE 30 MG/ML IJ SOLN
30.0000 mg | Freq: Once | INTRAMUSCULAR | Status: AC
  Administered 2021-11-27: 30 mg via INTRAMUSCULAR
  Filled 2021-11-27: qty 1

## 2021-11-27 MED ORDER — LORAZEPAM 1 MG PO TABS
0.5000 mg | ORAL_TABLET | Freq: Once | ORAL | Status: AC
  Administered 2021-11-27: 0.5 mg via ORAL
  Filled 2021-11-27: qty 1

## 2021-11-27 MED ORDER — HYDROCODONE-ACETAMINOPHEN 5-325 MG PO TABS
1.0000 | ORAL_TABLET | Freq: Once | ORAL | Status: AC
  Administered 2021-11-27: 1 via ORAL
  Filled 2021-11-27: qty 1

## 2021-11-27 MED ORDER — HYDROCODONE-ACETAMINOPHEN 5-325 MG PO TABS: 1.0000 | ORAL_TABLET | ORAL | 0 refills | Status: DC | PRN

## 2021-11-27 MED ORDER — ESCITALOPRAM OXALATE 10 MG PO TABS
10.0000 mg | ORAL_TABLET | Freq: Once | ORAL | Status: AC
  Administered 2021-11-27: 10 mg via ORAL
  Filled 2021-11-27: qty 1

## 2021-11-27 MED ORDER — IBUPROFEN 600 MG PO TABS: 600.0000 mg | ORAL_TABLET | Freq: Four times a day (QID) | ORAL | 0 refills | Status: DC | PRN
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
## 2022-02-21 ENCOUNTER — Ambulatory Visit (HOSPITAL_COMMUNITY)
Admission: EM | Admit: 2022-02-21 | Discharge: 2022-02-21 | Disposition: A | Discharge status: Home / Self Care | Payer: 59 | Attending: Physician Assistant | Admitting: Physician Assistant

## 2022-02-21 ENCOUNTER — Ambulatory Visit (INDEPENDENT_AMBULATORY_CARE_PROVIDER_SITE_OTHER): Payer: 59

## 2022-02-21 ENCOUNTER — Other Ambulatory Visit: Payer: Self-pay

## 2022-02-21 ENCOUNTER — Encounter (HOSPITAL_COMMUNITY): Payer: Self-pay

## 2022-02-21 DIAGNOSIS — J9811 Atelectasis: Secondary | ICD-10-CM | POA: Diagnosis present

## 2022-02-21 DIAGNOSIS — R0602 Shortness of breath: Secondary | ICD-10-CM | POA: Diagnosis present

## 2022-02-21 DIAGNOSIS — R9389 Abnormal findings on diagnostic imaging of other specified body structures: Secondary | ICD-10-CM | POA: Insufficient documentation

## 2022-02-21 DIAGNOSIS — R42 Dizziness and giddiness: Secondary | ICD-10-CM | POA: Insufficient documentation

## 2022-02-21 DIAGNOSIS — H538 Other visual disturbances: Secondary | ICD-10-CM | POA: Insufficient documentation

## 2022-02-21 DIAGNOSIS — J029 Acute pharyngitis, unspecified: Secondary | ICD-10-CM | POA: Diagnosis present

## 2022-02-21 LAB — POCT RAPID STREP A, ED / UC: Streptococcus, Group A Screen (Direct): NEGATIVE

## 2022-02-21 LAB — COMPREHENSIVE METABOLIC PANEL
ALT: 20 U/L (ref 0–44)
AST: 25 U/L (ref 15–41)
Albumin: 4.1 g/dL (ref 3.5–5.0)
Alkaline Phosphatase: 68 U/L (ref 38–126)
Anion gap: 13 (ref 5–15)
BUN: 11 mg/dL (ref 6–20)
CO2: 23 mmol/L (ref 22–32)
Calcium: 9.5 mg/dL (ref 8.9–10.3)
Chloride: 102 mmol/L (ref 98–111)
Creatinine, Ser: 0.85 mg/dL (ref 0.44–1.00)
GFR, Estimated: >60 mL/min (ref >60)
Glucose, Bld: 80 mg/dL (ref 70–99)
Potassium: 4.1 mmol/L (ref 3.5–5.1)
Sodium: 138 mmol/L (ref 135–145)
Total Bilirubin: 0.5 mg/dL (ref 0.3–1.2)
Total Protein: 8 g/dL (ref 6.5–8.1)

## 2022-02-21 LAB — CBC WITH DIFFERENTIAL/PLATELET
Abs Immature Granulocytes: 0.02 10*3/uL (ref 0.00–0.07)
Basophils Absolute: 0 10*3/uL (ref 0.0–0.1)
Basophils Relative: 0 %
Eosinophils Absolute: 0.1 10*3/uL (ref 0.0–0.5)
Eosinophils Relative: 2 %
HCT: 42.8 % (ref 36.0–46.0)
Hemoglobin: 13.7 g/dL (ref 12.0–15.0)
Immature Granulocytes: 0 %
Lymphocytes Relative: 36 %
Lymphs Abs: 3 10*3/uL (ref 0.7–4.0)
MCH: 29.5 pg (ref 26.0–34.0)
MCHC: 32 g/dL (ref 30.0–36.0)
MCV: 92 fL (ref 80.0–100.0)
Monocytes Absolute: 0.5 10*3/uL (ref 0.1–1.0)
Monocytes Relative: 7 %
Neutro Abs: 4.6 10*3/uL (ref 1.7–7.7)
Neutrophils Relative %: 55 %
Platelets: 332 10*3/uL (ref 150–400)
RBC: 4.65 MIL/uL (ref 3.87–5.11)
RDW: 13.2 % (ref 11.5–15.5)
WBC: 8.3 10*3/uL (ref 4.0–10.5)
nRBC: 0 % (ref 0.0–0.2)

## 2022-02-21 LAB — POCT URINALYSIS DIPSTICK, ED / UC
Bilirubin Urine: NEGATIVE
Glucose, UA: NEGATIVE mg/dL
Ketones, ur: NEGATIVE mg/dL
Leukocytes,Ua: NEGATIVE
Nitrite: NEGATIVE
Protein, ur: NEGATIVE mg/dL
Specific Gravity, Urine: >=1.03 (ref 1.005–1.030)
Urobilinogen, UA: 0.2 mg/dL (ref 0.0–1.0)
pH: 6 (ref 5.0–8.0)

## 2022-02-21 LAB — CBG MONITORING, ED: Glucose-Capillary: 86 mg/dL (ref 70–99)

## 2022-02-21 LAB — BRAIN NATRIURETIC PEPTIDE: B Natriuretic Peptide: 11.6 pg/mL (ref 0.0–100.0)

## 2022-02-21 LAB — POC URINE PREG, ED: Preg Test, Ur: NEGATIVE

## 2022-02-21 NOTE — ED Provider Notes (Signed)
[facility]    CSN: [id number] Arrival date & time: 02/21/22  1220      History   Chief Complaint Chief Complaint  Patient presents with   Headache   Shortness of Breath   Fatigue   Dizziness   Sore Throat    HPI Jenna Blair is a 37 y.o. female.   Patient presents today with a weeklong history of a constellation of symptoms.  She reports that 2 weeks ago she had a GI bug that lasted for approximately a week.  Nausea/vomiting/diarrhea has essentially improved but she continues to have other symptoms.  She does occasionally have loose bowel movements but denies any associated melena or hematochezia.  She is no longer experiencing nausea or vomiting is eating normally.  Denies any recent head injury.  Denies any recent medication changes.  Denies any known sick contacts or recent travel.  She reports ongoing headache as well as intermittent lightheadedness.  This is increased in frequency prompting evaluation and describes this as feeling that she is going to pass out; denies any syncopal episode.  She also reports shortness of breath particularly with ambulation.  She denies history of chronic lung condition including asthma or COPD.  She does not smoke.  She does report that approximately month ago she discontinued her anxiety/depression medication because she had trouble getting in for follow-up appointment but did not have any significant withdrawal symptoms with discontinuation of these medicines.  She reports some blurred/double vision that is intermittent as well.  She denies history of neurological condition including multiple sclerosis.  She does report occasional chest discomfort but is not currently experiencing the symptoms.    Past Medical History:  Diagnosis Date   Depression    Environmental allergies    Pre-diabetes    Sleep apnea    Uses C Pap nightly    Patient Active Problem List   Diagnosis Date Noted   MDD (major depressive disorder), single  episode, severe , no psychosis ([place]) 06/23/2021   Major depressive disorder, single episode, severe without psychosis ([place]) 06/22/2021   PTSD (post-traumatic stress disorder) 06/22/2021   Sleep apnea 04/20/2017   Bleeding internal hemorrhoids 01/07/2017   Morbid obesity ([place]) 12/03/2014   OSA (obstructive sleep apnea) 10/22/2014    Past Surgical History:  Procedure Laterality Date   CESAREAN SECTION     x1   LEG SURGERY Right    Injury as a child   TONSILLECTOMY/ADENOIDECTOMY/TURBINATE REDUCTION Bilateral 04/20/2017   Procedure: TONSILLECTOMY/ADENOIDECTOMY AND BILATERAL TURBINATE REDUCTION;  Surgeon: Jodi Marble, MD;  Location: [facility];  Service: ENT;  Laterality: Bilateral;   TOOTH EXTRACTION      OB History     Gravida  1   Para      Term      Preterm      AB      Living  1      SAB      IAB      Ectopic      Multiple      Live Births  1            Home Medications    Prior to Admission medications   Medication Sig Start Date End Date Taking? Authorizing Provider  ibuprofen (ADVIL) 600 MG tablet Take 1 tablet (600 mg total) by mouth every 6 (six) hours as needed. 11/27/21  Yes Isla Pence, MD  melatonin 5 MG TABS Take 1 tablet (5 mg total) by mouth at bedtime. 06/27/21  Yes Winfred Leeds, Nadir, MD  escitalopram (LEXAPRO) 10 MG tablet Take 1 tablet (10 mg total) by mouth daily. 06/28/21   Dian Situ, MD  hydrOXYzine (ATARAX) 25 MG tablet Take 1 tablet (25 mg total) by mouth 3 (three) times daily as needed for anxiety. 06/27/21   Dian Situ, MD  cetirizine (ZYRTEC) 10 MG tablet Take 1 tablet (10 mg total) by mouth 2 (two) times daily. Patient not taking: Reported on 08/03/2018 02/28/18 02/29/20  Raylene Everts, MD  ipratropium (ATROVENT) 0.03 % nasal spray Place 2 sprays into both nostrils every 12 (twelve) hours. Patient not taking: Reported on 08/06/2019  02/29/20  [provider]  loratadine (CLARITIN) 10 MG tablet Take 1 tablet (10 mg total) by  mouth daily. Patient not taking: Reported on 08/06/2019 08/03/18 02/29/20  Shelly Bombard, MD    Family History Family History  Problem Relation Age of Onset   OCD Mother    Arthritis Mother    Healthy Father    Diabetes Maternal Aunt    Diabetes Paternal Aunt    Diabetes Other    Heart disease Other    Hyperlipidemia Other    Hypertension Other    Stroke Other    Thyroid disease Other     Social History Social History   Tobacco Use   Smoking status: Never    Passive exposure: Never   Smokeless tobacco: Never  Vaping Use   Vaping Use: Never used  Substance Use Topics   Alcohol use: Yes    Comment: occasional   Drug use: No     Allergies   Patient has no known allergies.   Review of Systems Review of Systems  Constitutional:  Positive for activity change and fatigue. Negative for appetite change and fever.  HENT:  Positive for sore throat. Negative for congestion, sinus pressure and sneezing.   Eyes:  Positive for visual disturbance.  Respiratory:  Positive for shortness of breath. Negative for cough.   Cardiovascular:  Negative for chest pain (Intermittent; currently asymptomatic), palpitations and leg swelling.  Gastrointestinal:  Negative for abdominal pain, diarrhea, nausea and vomiting.  Musculoskeletal:  Negative for arthralgias and myalgias.  Neurological:  Positive for weakness (generalized), light-headedness and headaches. Negative for dizziness.     Physical Exam Triage Vital Signs ED Triage Vitals  Enc Vitals Group     BP --      Pulse Rate 02/21/22 1353 89     Resp 02/21/22 1353 16     Temp 02/21/22 1353 98.3 F (36.8 C)     Temp Source 02/21/22 1353 Oral     SpO2 02/21/22 1353 95 %     Weight --      Height --      Head Circumference --      Peak Flow --      Pain Score 02/21/22 1351 8     Pain Loc --      Pain Edu? --      Excl. in [place]? --    Orthostatic VS for the past 24 hrs:  BP- Lying Pulse- Lying BP- Sitting Pulse- Sitting BP-  Standing at 0 minutes Pulse- Standing at 0 minutes  02/21/22 1457 138/87 84 139/85 85 (!) 132/95 97    Updated Vital Signs BP (!) 148/107 (BP Location: Left Arm)   Pulse 89   Temp 98.3 F (36.8 C) (Oral)   Resp 16   LMP  (LMP Unknown)   SpO2 95%   Visual Acuity Right  Eye Distance:   Left Eye Distance:   Bilateral Distance:    Right Eye Near:   Left Eye Near:    Bilateral Near:     Physical Exam Vitals reviewed.  Constitutional:      General: She is awake. She is not in acute distress.    Appearance: Normal appearance. She is well-developed. She is not ill-appearing.     Comments: Very pleasant female appears stated age in no acute distress sitting comfortably in exam room  HENT:     Head: Normocephalic and atraumatic. No raccoon eyes, Battle's sign or contusion.     Right Ear: Tympanic membrane, ear canal and external ear normal. No hemotympanum.     Left Ear: Tympanic membrane, ear canal and external ear normal. No hemotympanum.     Mouth/Throat:     Tongue: Tongue does not deviate from midline.     Pharynx: Uvula midline. No oropharyngeal exudate or posterior oropharyngeal erythema.     Tonsils: 0 on the right. 0 on the left.  Eyes:     Extraocular Movements: Extraocular movements intact.     Pupils: Pupils are equal, round, and reactive to light.  Cardiovascular:     Rate and Rhythm: Normal rate and regular rhythm.     Heart sounds: Normal heart sounds, S1 normal and S2 normal. No murmur heard. Pulmonary:     Effort: Pulmonary effort is normal.     Breath sounds: Normal breath sounds. No wheezing, rhonchi or rales.     Comments: Clear auscultation bilaterally Abdominal:     Palpations: Abdomen is soft.     Tenderness: There is no abdominal tenderness.  Musculoskeletal:     Cervical back: Normal range of motion and neck supple. No spinous process tenderness or muscular tenderness.     Right lower leg: No edema.     Left lower leg: No edema.     Comments:  Strength 5/5 bilateral upper and lower extremities  Lymphadenopathy:     Head:     Right side of head: No submental, submandibular or tonsillar adenopathy.     Left side of head: No submental, submandibular or tonsillar adenopathy.  Neurological:     General: No focal deficit present.     Motor: Motor function is intact.     Coordination: Coordination is intact.     Gait: Gait is intact.     Comments: Cranial nerves II through XII grossly intact  Psychiatric:        Behavior: Behavior is cooperative.      UC Treatments / Results  Labs (all labs ordered are listed, but only abnormal results are displayed) Labs Reviewed  POCT URINALYSIS DIPSTICK, ED / UC - Abnormal; Notable for the following components:      Result Value   Hgb urine dipstick TRACE (*)    All other components within normal limits  CBC WITH DIFFERENTIAL/PLATELET  COMPREHENSIVE METABOLIC PANEL  BRAIN NATRIURETIC PEPTIDE  POC URINE PREG, ED  CBG MONITORING, ED  POCT RAPID STREP A, ED / UC    EKG   Radiology DG Chest 2 View  Result Date: 02/21/2022 CLINICAL DATA:  Shortness of breath EXAM: CHEST - 2 VIEW COMPARISON:  Chest radiograph November 27, 2021 FINDINGS: The heart size and mediastinal contours are within normal limits. Streaky opacities in the bilateral lung bases are favored to reflect atelectasis. No pleural effusion. No pneumothorax. The visualized skeletal structures are unremarkable. IMPRESSION: Streaky opacities in the bilateral lung bases are favored to  reflect atelectasis. Electronically Signed   By: Maudry Mayhew M.D.   On: 02/21/2022 15:48    Procedures Procedures (including critical care time)  Medications Ordered in UC Medications - No data to display  Initial Impression / Assessment and Plan / UC Course  I have reviewed the triage vital signs and the nursing notes.  Pertinent labs & imaging results that were available during my care of the patient were reviewed by me and considered in my  medical decision making (see chart for details).     Patient is well-appearing, afebrile, nontoxic, nontachycardic, with oxygen saturation of 95%.  Given her constellation of symptoms we did discuss potential utility of going to the emergency room for evaluation including shortness of breath with intermittent chest discomfort, however, patient reported that she is currently asymptomatic and declined emergency evaluation at this time preferring [place] to start outpatient workup.  EKG was obtained that showed normal sinus rhythm with ventricular rate of 81 bpm without ischemic changes; compared to 12/23/2019 tracing no significant change.  Urine pregnancy was negative.  UA did show trace hemoglobin is otherwise normal.  Fasting glucose was appropriate.  Strep test was negative.  Orthostatic vital signs were normal.  Chest x-ray did show likely atelectasis and she was encouraged to use incentive spirometer to help manage this.  She was provided an incentive spirometer during her visit with instruction how to properly use this.  We again discussed that given her constellation of symptoms and how poorly she is feeling and is reasonable to go to the emergency room where they have more advanced imaging imaging and stat labs.  Patient continued to decline this.  We initially had difficulty obtaining blood but likely were able to obtain this just prior to her discharge.  CBC, CMP, BNP obtained today and is pending.  Will make additional recommendations based on laboratory findings.  Given her intermittent chest discomfort, lightheadedness, shortness of breath recommended that she follow-up with cardiology and was given contact information for local provider with instruction call to schedule an appointment as soon as possible.  Also recommend that she follow-up with her primary care soon as possible; reports she has an appointment 02/23/2022.  At the end of visit patient still did not want to be evaluated by the emergency room  but did agree to go if she has any worsening or changing symptoms.  We discussed at length alarm symptoms that warrant emergent evaluation.  Strict return precautions given.  Work excuse note provided.  Final Clinical Impressions(s) / UC Diagnoses   Final diagnoses:  Episodic lightheadedness  Shortness of breath  Blurred vision, bilateral  Sore throat  Atelectasis  Abnormal chest x-ray     Discharge Instructions      Your x-ray showed that you are not taking very deep breaths.  Please use the incentive spirometer that we gave you in order to improve this.  We will contact you tomorrow if your blood work is abnormal.  As we discussed, given you are having shortness of breath and other symptoms it is reasonable to go to the emergency room.  If your symptoms are improving or if anything worsens I do recommend you go to the ER immediately.  Follow-up with your primary care first thing tomorrow.  Call and schedule an appointment with cardiology soon as possible.     ED Prescriptions   None    PDMP not reviewed this encounter.   Jeani Hawking, PA-C 02/21/22 1619

## 2022-02-21 NOTE — Discharge Instructions (Signed)
Your x-ray showed that you are not taking very deep breaths.  Please use the incentive spirometer that we gave you in order to improve this.  We will contact you tomorrow if your blood work is abnormal.  As we discussed, given you are having shortness of breath and other symptoms it is reasonable to go to the emergency room.  If your symptoms are improving or if anything worsens I do recommend you go to the ER immediately.  Follow-up with your primary care first thing tomorrow.  Call and schedule an appointment with cardiology soon as possible.

## 2022-02-21 NOTE — ED Notes (Signed)
Notified Erin, PA of inability to obtain a successful lab draw .  No further attempts

## 2022-02-21 NOTE — ED Triage Notes (Signed)
Pt is here for blurry vision, SOB, fatigue, dizziness and sore throat x 1wk

## 2022-02-25 ENCOUNTER — Encounter: Payer: Self-pay | Admitting: Internal Medicine

## 2022-02-26 ENCOUNTER — Other Ambulatory Visit (HOSPITAL_COMMUNITY): Payer: 59

## 2022-02-26 ENCOUNTER — Other Ambulatory Visit (HOSPITAL_COMMUNITY): Payer: Self-pay | Admitting: Ophthalmology

## 2022-02-26 DIAGNOSIS — H4922 Sixth [abducent] nerve palsy, left eye: Secondary | ICD-10-CM

## 2022-03-03 ENCOUNTER — Ambulatory Visit: Payer: 59 | Admitting: Internal Medicine

## 2022-03-10 ENCOUNTER — Ambulatory Visit: Payer: 59 | Attending: Internal Medicine | Admitting: Internal Medicine

## 2022-03-10 ENCOUNTER — Encounter: Payer: Self-pay | Admitting: Internal Medicine

## 2022-03-10 VITALS — BP 132/92 | HR 91 | Ht 60.0 in | Wt 253.0 lb

## 2022-03-10 DIAGNOSIS — R0602 Shortness of breath: Secondary | ICD-10-CM

## 2022-03-10 NOTE — Progress Notes (Signed)
Cardiology Office Note:    Date:  03/10/2022   ID:  Jenna Blair, DOB 1985-04-07, MRN [id number]  PCP:  [facility], [facility]   [facility] Providers Cardiologist:  Janina Mayo, MD     Referring MD: Care, Jinny Blossom [place]*   No chief complaint on file. ED follow up  History of Present Illness:    Jenna Blair is a 37 y.o. female with a hx of depression, sleep apnea, went to the ED for a constellation of symptoms and then referred to cardiology. As past of her multitude of symptoms she also reported CP, LH and SOB which prompted the referral. EKG showed sinus rhythm with LVH. She notes her main issue was breathing issues and migraines. No wheezing. No hx of asthma. She was given mucinex. She has a primary care doctor and planned for a brain MRI. She notes SOB for a week and a half. This has improved. No family hx of cardiac dx. No LE edema. No orthopnea or PND. BNP was low.  Past Medical History:  Diagnosis Date   Depression    Environmental allergies    Pre-diabetes    Sleep apnea    Uses C Pap nightly    Past Surgical History:  Procedure Laterality Date   CESAREAN SECTION     x1   LEG SURGERY Right    Injury as a child   TONSILLECTOMY/ADENOIDECTOMY/TURBINATE REDUCTION Bilateral 04/20/2017   Procedure: TONSILLECTOMY/ADENOIDECTOMY AND BILATERAL TURBINATE REDUCTION;  Surgeon: Jodi Marble, MD;  Location: [facility];  Service: ENT;  Laterality: Bilateral;   TOOTH EXTRACTION      Current Medications: Current Outpatient Medications on File Prior to Visit  Medication Sig Dispense Refill   escitalopram (LEXAPRO) 10 MG tablet Take 1 tablet (10 mg total) by mouth daily. 30 tablet 0   LORazepam (ATIVAN) 1 MG tablet Take 1 mg by mouth daily as needed for anxiety.     REXULTI 1 MG TABS tablet Take 1 mg by mouth daily.     traZODone (DESYREL) 50 MG tablet Take 100 mg by mouth at bedtime as needed for sleep.     hydrOXYzine (ATARAX) 25 MG tablet Take 1  tablet (25 mg total) by mouth 3 (three) times daily as needed for anxiety. (Patient not taking: Reported on 03/10/2022) 30 tablet 0   ibuprofen (ADVIL) 600 MG tablet Take 1 tablet (600 mg total) by mouth every 6 (six) hours as needed. (Patient not taking: Reported on 03/10/2022) 30 tablet 0   melatonin 5 MG TABS Take 1 tablet (5 mg total) by mouth at bedtime. (Patient not taking: Reported on 03/10/2022) 30 tablet 0   [DISCONTINUED] cetirizine (ZYRTEC) 10 MG tablet Take 1 tablet (10 mg total) by mouth 2 (two) times daily. (Patient not taking: Reported on 08/03/2018) 30 tablet 0   [DISCONTINUED] ipratropium (ATROVENT) 0.03 % nasal spray Place 2 sprays into both nostrils every 12 (twelve) hours. (Patient not taking: Reported on 08/06/2019)     [DISCONTINUED] loratadine (CLARITIN) 10 MG tablet Take 1 tablet (10 mg total) by mouth daily. (Patient not taking: Reported on 08/06/2019) 30 tablet 11   No current facility-administered medications on file prior to visit.    Allergies:   Patient has no known allergies.   Social History   Socioeconomic History   Marital status: Single    Spouse name: Not on file   Number of children: 1   Years of education: Not on file   Highest education  level: Not on file  Occupational History   Not on file  Tobacco Use   Smoking status: Never    Passive exposure: Never   Smokeless tobacco: Never  Vaping Use   Vaping Use: Never used  Substance and Sexual Activity   Alcohol use: Yes    Comment: occasional   Drug use: No   Sexual activity: Yes    Partners: Male    Birth control/protection: Implant  Other Topics Concern   Not on file  Social History Narrative   Not on file   Social Determinants of Health   Financial Resource Strain: Not on file  Food Insecurity: Not on file  Transportation Needs: Not on file  Physical Activity: Not on file  Stress: Not on file  Social Connections: Not on file     Family History: The patient's family history includes  Arthritis in her mother; Diabetes in her maternal aunt, paternal aunt, and another family member; Healthy in her father; Heart disease in an other family member; Hyperlipidemia in an other family member; Hypertension in an other family member; OCD in her mother; Stroke in an other family member; Thyroid disease in an other family member.  ROS:   Please see the history of present illness.     All other systems reviewed and are negative.  EKGs/Labs/Other Studies Reviewed:    The following studies were reviewed today:   EKG:    Recent Labs: 06/24/2021: TSH 1.760 02/21/2022: ALT 20; B Natriuretic Peptide 11.6; BUN 11; Creatinine, Ser 0.85; Hemoglobin 13.7; Platelets 332; Potassium 4.1; Sodium 138   Recent Lipid Panel    Component Value Date/Time   CHOL 171 06/24/2021 0629   CHOL 216 (H) 07/21/2020 1054   TRIG 117 06/24/2021 0629   HDL 36 (L) 06/24/2021 0629   HDL 46 07/21/2020 1054   CHOLHDL 4.8 06/24/2021 0629   VLDL 23 06/24/2021 0629   LDLCALC 112 (H) 06/24/2021 0629   LDLCALC 151 (H) 07/21/2020 1054     Risk Assessment/Calculations:     Physical Exam:    VS:  BP (!) 132/92 (BP Location: Left Arm, Patient Position: Sitting, Cuff Size: Large)   Pulse 91   Ht 5' (1.524 m)   Wt 253 lb (114.8 kg)   LMP  (LMP Unknown)   SpO2 98%   BMI 49.41 kg/m     Wt Readings from Last 3 Encounters:  03/10/22 253 lb (114.8 kg)  07/21/20 243 lb 12.8 oz (110.6 kg)  05/28/20 246 lb (111.6 kg)     GEN: obese, Well nourished, well developed in no acute distress HEENT: Normal NECK: No JVD; No carotid bruits LYMPHATICS: No lymphadenopathy CARDIAC: RRR, no murmurs, rubs, gallops RESPIRATORY:  Clear to auscultation without rales, wheezing or rhonchi  ABDOMEN: Soft, non-tender, non-distended MUSCULOSKELETAL:  No edema; No deformity  SKIN: Warm and dry NEUROLOGIC:  Alert and oriented x 3 PSYCHIATRIC:  Normal affect   ASSESSMENT:    SOB: no signs of CHF. Had LVH on her EKG. No clinical  signs of HCM or family hx. To be thorough, will get a TTE PLAN:    In order of problems listed above:  TTE Follow up pending results       Medication Adjustments/Labs and Tests Ordered: Current medicines are reviewed at length with the patient today.  Concerns regarding medicines are outlined above.  Orders Placed This Encounter  Procedures   ECHOCARDIOGRAM COMPLETE   No orders of the defined types were placed in this encounter.  Patient Instructions  Medication Instructions:   No changes  *If you need a refill on your cardiac medications before your next appointment, please call your pharmacy*   Lab Work: Not needed   Testing/Procedures:  Will be schedule at [place] has requested that you have an echocardiogram. Echocardiography is a painless test that uses sound waves to create images of your heart. It provides your doctor with information about the size and shape of your heart and how well your heart's chambers and valves are working. This procedure takes approximately one hour. There are no restrictions for this procedure. Please do NOT wear cologne, perfume, aftershave, or lotions (deodorant is allowed). Please arrive 15 minutes prior to your appointment time.   Follow-Up: At [facility], you and your health needs are our priority.  As part of our continuing mission to provide you with exceptional heart care, we have created designated Provider Care Teams.  These Care Teams include your primary Cardiologist (physician) and Advanced Practice Providers (APPs -  Physician Assistants and Nurse Practitioners) who all work together to provide you with the care you need, when you need it.     Your next appointment:   1 month(s)  The format for your next appointment:   In Person  Provider:   Janina Mayo, MD    Other Instructions    Signed, Janina Mayo, MD  03/10/2022 10:31 AM    [place]

## 2022-03-10 NOTE — Patient Instructions (Signed)
Medication Instructions:   No changes  *If you need a refill on your cardiac medications before your next appointment, please call your pharmacy*   Lab Work: Not needed   Testing/Procedures:  Will be schedule at [place] has requested that you have an echocardiogram. Echocardiography is a painless test that uses sound waves to create images of your heart. It provides your doctor with information about the size and shape of your heart and how well your heart's chambers and valves are working. This procedure takes approximately one hour. There are no restrictions for this procedure. Please do NOT wear cologne, perfume, aftershave, or lotions (deodorant is allowed). Please arrive 15 minutes prior to your appointment time.   Follow-Up: At [facility], you and your health needs are our priority.  As part of our continuing mission to provide you with exceptional heart care, we have created designated Provider Care Teams.  These Care Teams include your primary Cardiologist (physician) and Advanced Practice Providers (APPs -  Physician Assistants and Nurse Practitioners) who all work together to provide you with the care you need, when you need it.     Your next appointment:   1 month(s)  The format for your next appointment:   In Person  Provider:   Janina Mayo, MD    Other Instructions

## 2022-03-17 ENCOUNTER — Telehealth: Payer: Self-pay | Admitting: Neurology

## 2022-03-17 NOTE — Telephone Encounter (Signed)
Left message on VM for patient please schedule for 3/15 at 9:15 or 11:15 AM with Dr. Jaynee Eagles referral from Dr. Katy Fitch. Can transfer to me if needed

## 2022-04-07 ENCOUNTER — Other Ambulatory Visit (HOSPITAL_COMMUNITY): Payer: 59

## 2022-04-07 ENCOUNTER — Encounter (HOSPITAL_COMMUNITY): Payer: Self-pay | Admitting: Internal Medicine

## 2022-04-10 IMAGING — DX DG CHEST 2V
2 series · 2 of 2 positions shown · non-contrast
Comparison: Chest radiograph dated 05/11/2010

CLINICAL DATA: Shortness of breath

EXAM:
CHEST - 2 VIEW

[chest pa]
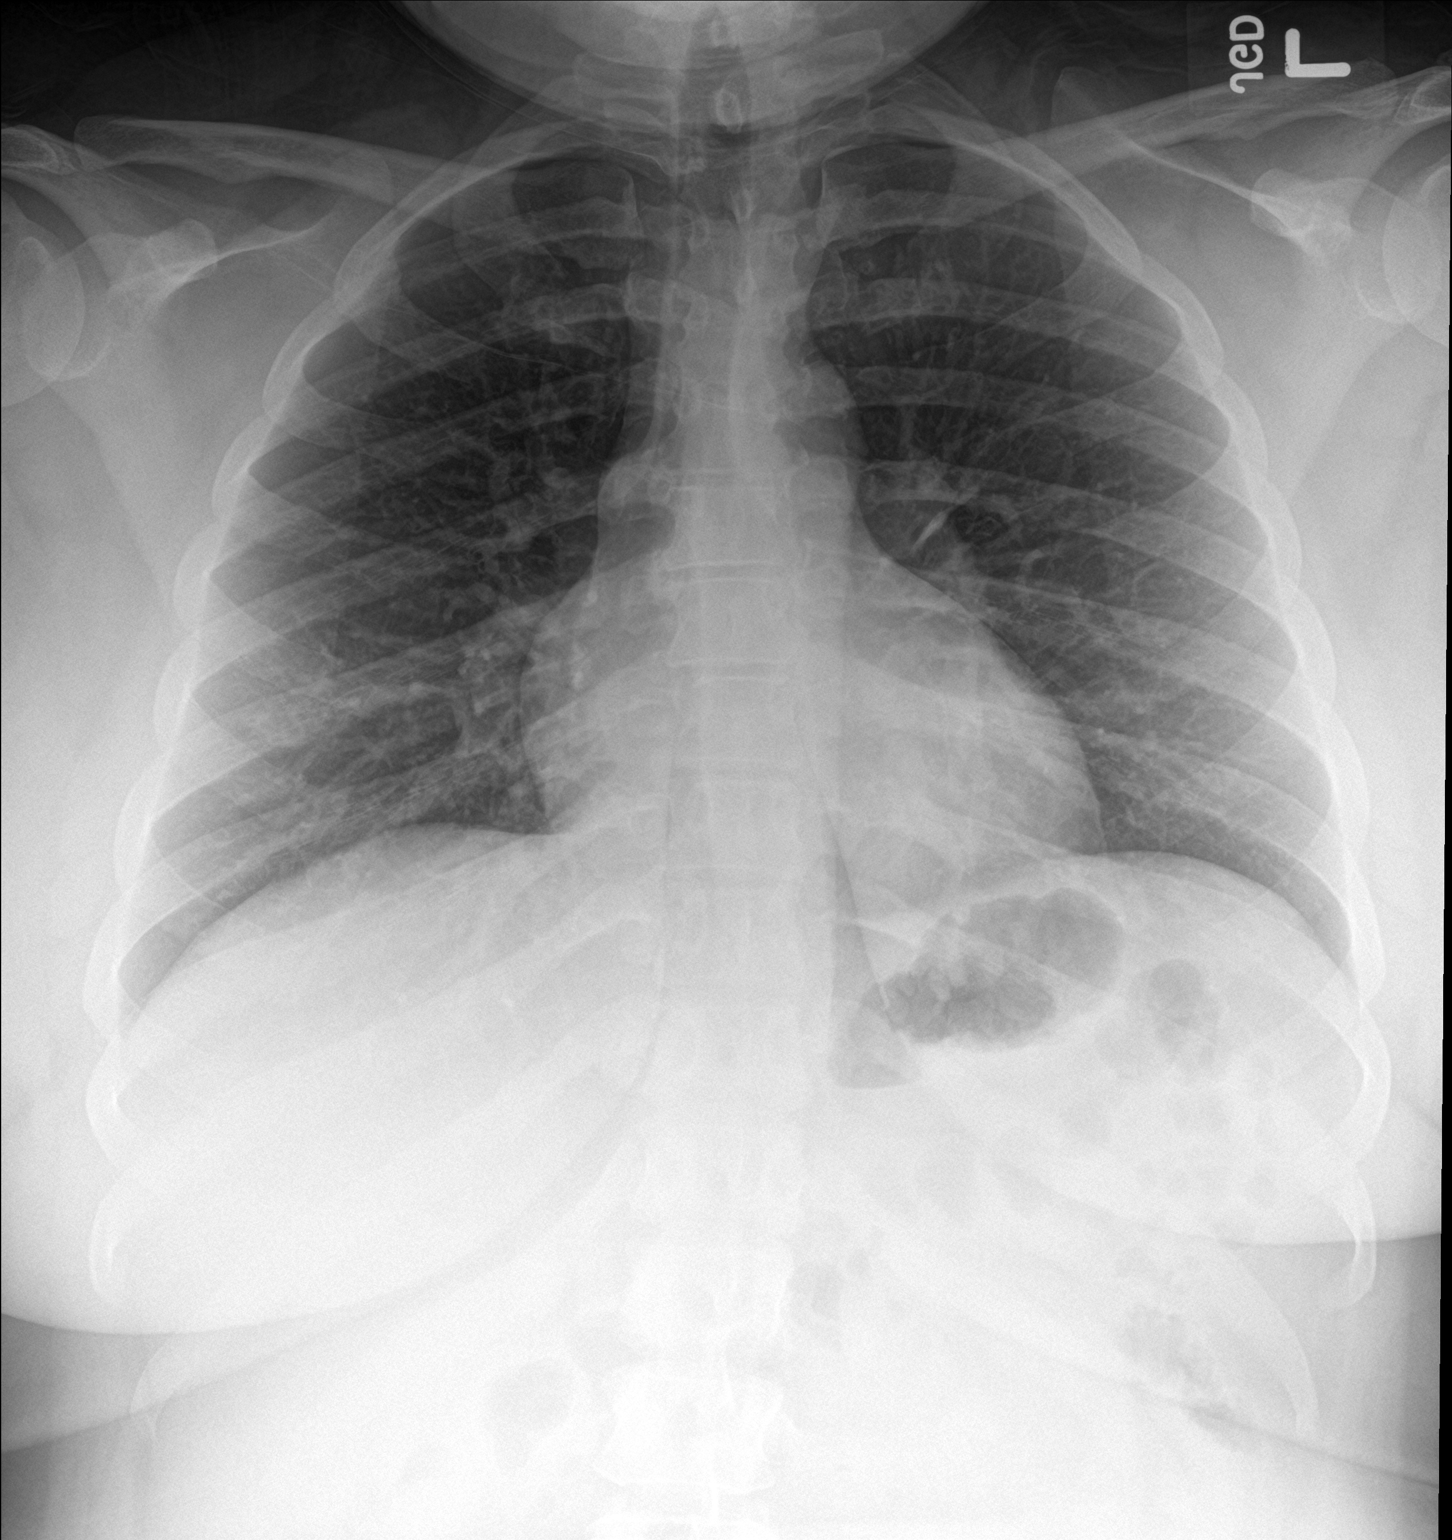

[chest lat]
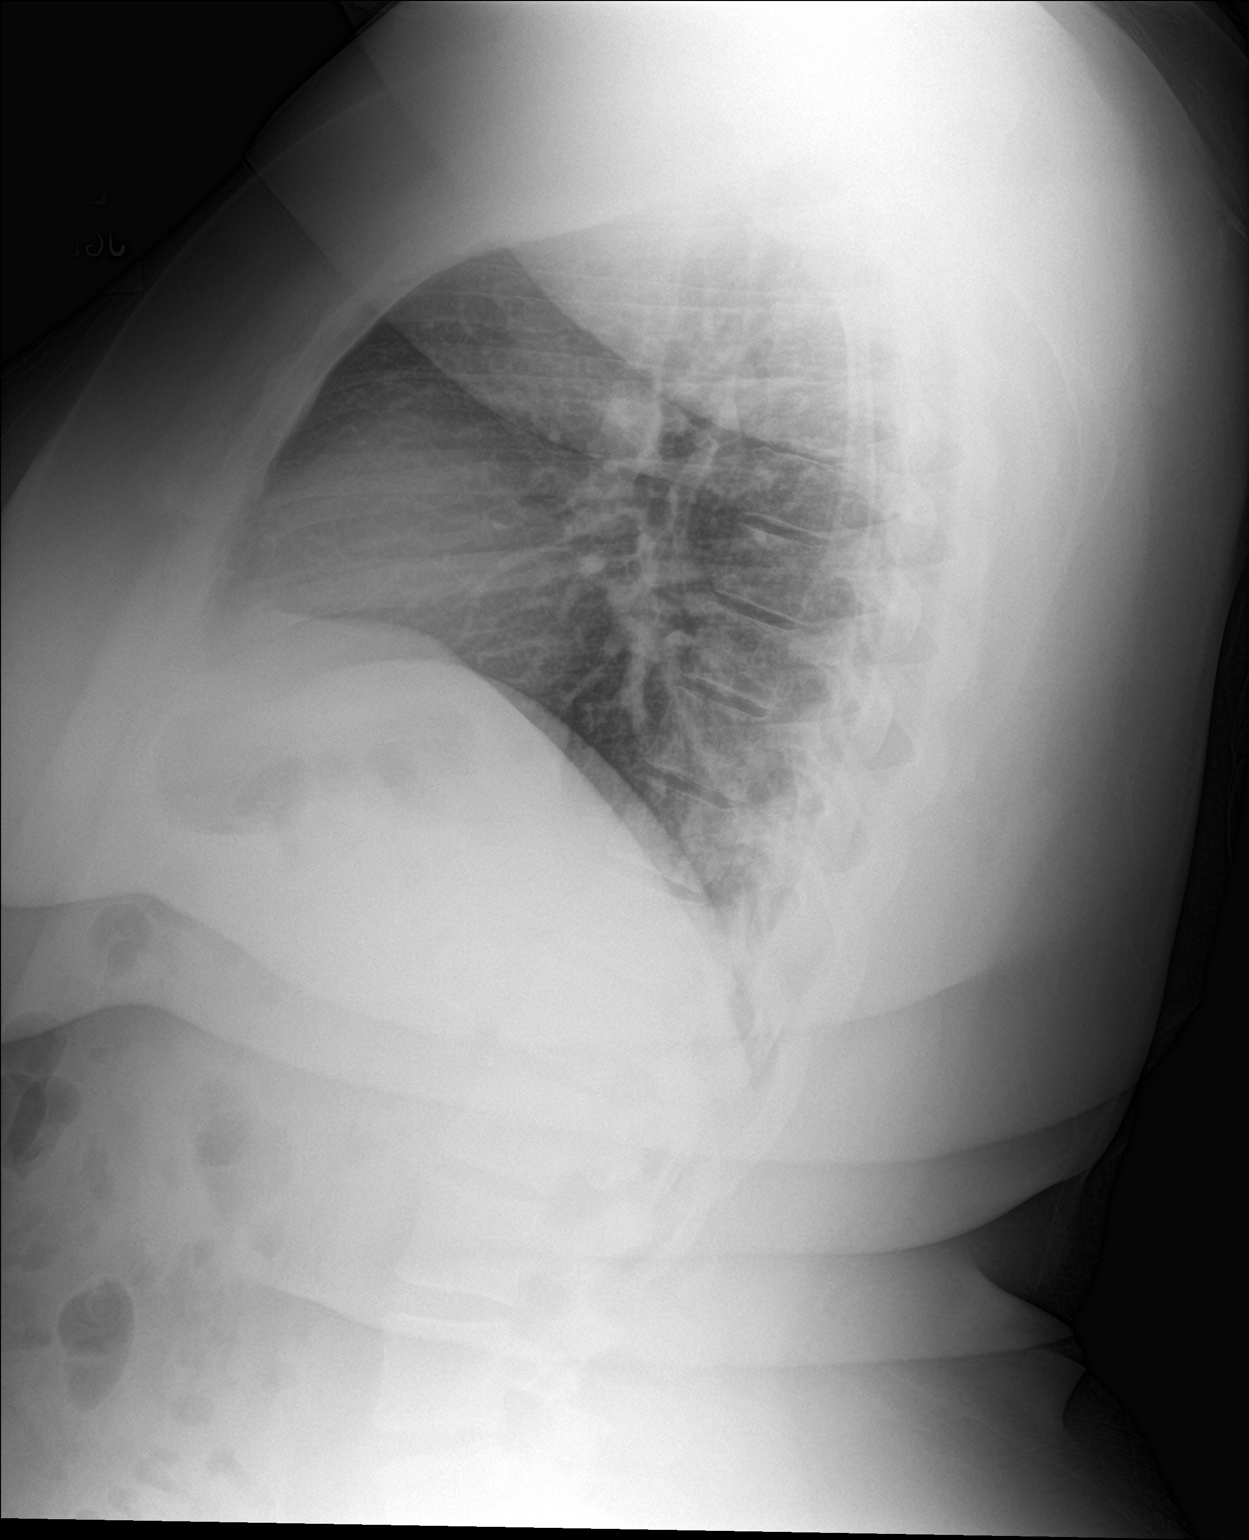

[2 of 2 positions shown; findings below may reference images not displayed]

FINDINGS: The heart size and mediastinal contours are within normal limits.
Both lungs are clear. The visualized skeletal structures are
unremarkable.
IMPRESSION: No active cardiopulmonary disease.

## 2022-04-12 ENCOUNTER — Ambulatory Visit: Payer: No Typology Code available for payment source | Admitting: Internal Medicine

## 2022-04-21 DIAGNOSIS — G4730 Sleep apnea, unspecified: Secondary | ICD-10-CM | POA: Diagnosis not present

## 2022-04-21 DIAGNOSIS — R7303 Prediabetes: Secondary | ICD-10-CM | POA: Diagnosis not present

## 2022-04-22 ENCOUNTER — Other Ambulatory Visit (HOSPITAL_COMMUNITY): Payer: Self-pay | Admitting: Surgery

## 2022-04-27 NOTE — Progress Notes (Deleted)
   Cardiology Clinic Note   Date: 04/27/2022 ID: Jenna Blair, DOB 02/20/1985, MRN [id number]  Primary Cardiologist:  Janina Mayo, MD  Patient Profile    Jenna Blair is a 37 y.o. female who presents to the clinic today for 1 month follow-up.  Past medical history significant for: Depression. OSA. Migraines.   History of Present Illness    Jenna Blair was first evaluated by Dr. Harl Bowie on 03/10/2022 for chest pain and shortness of breath following an ER visit.  Patient presented to the ER on 02/21/2022 with multiple complaints including blurry vision, shortness of breath, fatigue, dizziness and sore throat x 1 week.  2 weeks prior to ER visit patient experienced 1 week of nausea, and vomiting, diarrhea.  She also reported an ongoing headache with intermittent lightheadedness.  She reported shortness of breath occurred with ambulation.  BNP along with other labs were normal.  Chest x-ray showed bilateral atelectasis.  She was provided with an incentive spirometer and instructed to follow-up with primary care and cardiology.  At office visit with Dr. Harl Bowie patient reported her biggest issue was shortness of breath and migraine headaches.  She reported shortness of breath was improving.  She denied family history of cardiac disease.  Echo was ordered but it does not appear to have been completed.  Today, patient ***  ***   ROS: All other systems reviewed and are otherwise negative except as noted in History of Present Illness.  Studies Reviewed    ECG personally reviewed by me today: ***  No significant changes from ***  Risk Assessment/Calculations    {Does this patient have ATRIAL FIBRILLATION?:[phone]} No BP recorded.  {Refresh Note OR Click here to enter BP  :1}***        Physical Exam    VS:  There were no vitals taken for this visit. , BMI There is no height or weight on file to calculate BMI.  GEN: Well nourished, well developed, in no acute  distress. Neck: No JVD or carotid bruits. Cardiac: *** RRR. No murmurs. No rubs or gallops.   Respiratory:  Respirations regular and unlabored. Clear to auscultation without rales, wheezing or rhonchi. GI: Soft, nontender, nondistended. Extremities: Radials/DP/PT 2+ and equal bilaterally. No clubbing or cyanosis. No edema ***  Skin: Warm and dry, no rash. Neuro: Strength intact.  Assessment & Plan   ***  Disposition: ***     {Are you ordering a CV Procedure (e.g. stress test, cath, DCCV, TEE, etc)?   Press F2        :[id number]   Signed, Justice Britain. Ebb Carelock, DNP, NP-C

## 2022-04-29 ENCOUNTER — Ambulatory Visit: Payer: No Typology Code available for payment source | Admitting: Student

## 2022-05-10 ENCOUNTER — Encounter: Payer: Self-pay | Admitting: Dietician

## 2022-05-10 ENCOUNTER — Encounter: Payer: 59 | Attending: Surgery | Admitting: Dietician

## 2022-05-10 VITALS — Ht 60.0 in | Wt 241.1 lb

## 2022-05-10 DIAGNOSIS — E669 Obesity, unspecified: Secondary | ICD-10-CM | POA: Diagnosis present

## 2022-05-10 NOTE — Progress Notes (Signed)
Nutrition Assessment for Bariatric Surgery: Pre-Surgery Behavioral and Nutrition Intervention Program   Medical Nutrition Therapy  Appt Start Time: 9:25    End Time: 10:33  Patient was seen on 05/10/2022 for Pre-Operative Nutrition Assessment. Purpose of todays visit  enhance perioperative outcomes along with a healthy weight maintenance   Referral stated Supervised Weight Loss (SWL) visits needed: 12  Planned surgery: Sleeve Gastrectomy  Pt expectation of surgery: to be 180-190; and health reasons   NUTRITION ASSESSMENT   Anthropometrics  Start weight at [facility]: 241.1 lbs (date: 05/10/2022)  Height: 60 in BMI: 47.09 kg/m2     Clinical   Pharmacotherapy: History of weight loss medication used: not started due to availability  Medical hx: sleep apnea, obesity, PTSD Medications: escitalopram, hydrOXYzine, ibuprofen, LORazepam, melatonin, REXULTI Labs: cholesterol 207; LDL 145; A1c 5.9; Vit D 8.1 Notable signs/symptoms: none noted Any previous deficiencies? No  Evaluation of Nutritional Deficiencies: Micronutrient Nutrition Focused Physical Exam: Hair: No issues observed Eyes: No issues observed Mouth: No issues observed Neck: No issues observed Nails: No issues observed Skin: No issues observed  Lifestyle & Dietary Hx  Pt states she is a single mother of two teenagers, stating she is stressed and has been through a lot in her young life.  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 Pt states she will try to walk around the block on her days off. Pt states she does not eat breakfast much of the week, stating she will skip lunch as well.  Pt states she is very busy each day. Pt states her 63 year old daughter may cook dinner. Pt states her food stamps were taken away, stating she will find a way to get the food they need.  Current Physical Activity Recommendations state 150 minutes per week of moderate to vigorous movement including Cardio and 1-2 days of  resistance activities as well as flexibility/balance activities:  Pts current physical activity: ADLs, with 0% recommendation reached    Sleep Hygiene: duration and quality: Pt states she uses trazodone to help her sleep.  Current Patient Perceived Stress Level as stated by pt on a scale of 1-10: 10       Stress Management Techniques: music, drive around, medication for anxiety and depression  According to the Dietary Guidelines for Americans Recommendation: equivalent 1.5-2 cups fruits per day, equivalent 2-3 cups vegetables per day and at least half all grains whole  Fruit servings per day (on average): 1, meeting 33% recommendation  Non-starchy vegetable servings per day (on average): 1, meeting 33% recommendation  Whole Grains per day (on average): 0-1  Number of meals missed/skipped per week out of 21: 7-10  24-Hr Dietary Recall First Meal: skip or bacon egg and cheese or cheese grits or bagels or cereal Snack: pretzels or yogurt Second Meal: skip or salad or burger or sub or chicken wings Snack: maybe pretzels or yogurt or chips or granola bar Third Meal: spaghetti or hamburgers or hot dog or steak salad Snack: chips and dip some times or something simple Beverages: water, lemonade or cheerwine or sprite or ginger ale  Alcoholic beverages per week: 2   Estimated Energy Needs Calories: 1500   NUTRITION DIAGNOSIS  Overweight/obesity ([place]-3.3) related to past poor dietary habits and physical inactivity as evidenced by patient w/ planned sleeve surgery following dietary guidelines for continued weight loss.    NUTRITION INTERVENTION  Nutrition counseling (C-1) and education (E-2) to facilitate bariatric surgery goals.  Educated pt on micronutrient deficiencies  post-surgery and behavioral/dietary strategies to start in order to mitigate that risk   Behavioral and Dietary Interventions Pre-Op Goals Reviewed with the Patient Nutrition: Healthy Eating Behaviors Switch to  non-caloric, non-carbonated and non-caffeinated beverages such as  water, unsweetened tea, Crystal Light and zero calorie beverages (aim for 64 oz. per day) Cut out grazing between meals or at night  Find a protein shake you like Eat every 3-5 hours        Eliminate distractions while eating (TV, computer, reading, driving, texting) Take 16-10 minutes to eat a meal  Decrease high sugar foods/decrease high fat/fried foods Eliminate alcoholic beverages Increase protein intake (eggs, fish, chicken, yogurt) before surgery Eat non starchy vegetables 2 times a day 7 days a week Eat complex carbohydrates such as whole grains and fruits   Behavioral Modification: Physical Activity Increase my usual daily activity (use stairs, park farther, etc.) Engage in _________Walking______________  activity  ____20___ minutes ___2___ times per week  Other:    _________________________________________________________________     Problem Solving I will think about my usual eating patterns and how to tweak them How can my friends and family support me Barriers to starting my changes Learn and understand appetite verses hunger   Healthy Coping Allow for ___________ activities per week to help me manage stress Reframe negative thoughts I will keep a picture of someone or something that is my inspiration & look at it daily   Monitoring  Weigh myself once a week  Measure my progress by monitoring how my clothes fit Keep a food record of what I eat and drink for the next ________ (time period) Take pictures of what I eat and drink for the next ________ (time period) Use an app to count steps/day for the next_______ (time period) Measure my progress such as increased energy and more restful sleep Monitor your acid reflux and bowel habits, are they getting better?   *Goals that are bolded indicate the pt would like to start working towards these  Handouts Provided Include  Bariatric Surgery handouts  (Nutrition Visits, Pre Surgery Behavioral Change Goals, Protein Shakes Brands to Choose From, Vitamins & Mineral Supplementation)  Learning Style & Readiness for Change Teaching method utilized: Visual, Auditory, and hands on  Demonstrated degree of understanding via: Teach Back  Readiness Level: contemplative Barriers to learning/adherence to lifestyle change: busy schedule; single mother  RD's Notes for Next Visit Progress toward patient chosen goals    MONITORING & EVALUATION Dietary intake, weekly physical activity, body weight, and preoperative behavioral change goals   Next Steps  Patient is to follow up at [facility] in two weeks for next SWL visit.

## 2022-05-24 ENCOUNTER — Encounter: Payer: 59 | Admitting: Dietician

## 2022-05-24 ENCOUNTER — Encounter: Payer: Self-pay | Admitting: Dietician

## 2022-05-24 VITALS — Ht 60.0 in | Wt 243.8 lb

## 2022-05-24 DIAGNOSIS — E669 Obesity, unspecified: Secondary | ICD-10-CM | POA: Diagnosis not present

## 2022-05-24 NOTE — Progress Notes (Signed)
Supervised Weight Loss Visit Bariatric Nutrition Education Appt Start Time: 9:26    End Time: 9:45  Planned surgery: Sleeve Gastrectomy  Pt expectation of surgery: to be 180-190; and health reasons  Referral stated Supervised Weight Loss (SWL) visits needed: 12  1 out of 12 SWL Appointments   NUTRITION ASSESSMENT   Anthropometrics  Start weight at [facility]: 241.1 lbs (date: 05/10/2022)  Height: 60 in Weight today: 243.8 lb BMI: 47.61 kg/m2     Clinical  History of weight loss medication used: not started due to availability Medical hx: sleep apnea, obesity, PTSD Medications: escitalopram, hydrOXYzine, ibuprofen, LORazepam, melatonin, REXULTI Labs: cholesterol 207; LDL 145; A1c 5.9; Vit D 8.1 Notable signs/symptoms: none noted Any previous deficiencies? No  Lifestyle & Dietary Hx  Pt states she can use YouTube to get her physical activity, stating she can work out on her off days, for 20-30 minutes 2 days a week, when she can't get outside. Pt states she is cutting back on alcoholic beverages, stating she is avoiding alcohol during the week and only on the weekends right now. Pt agreeable to working on eating throughout the day avoiding skipping meals.  Estimated daily fluid intake: 36-48 oz Supplements: none Current average weekly physical activity: walking at work; ADLs  24-Hr Dietary Recall First Meal: skip or bacon egg and cheese or cheese grits or bagels or cereal Snack: pretzels or yogurt Second Meal: skip or salad or burger or sub or chicken wings Snack: maybe pretzels or yogurt or chips or granola bar Third Meal: spaghetti or hamburgers or hot dog or steak salad Snack: chips and dip some times or something simple Beverages: water, lemonade or cheerwine or sprite or ginger ale  Estimated Energy Needs Calories: 1500   NUTRITION DIAGNOSIS  Overweight/obesity ([place]-3.3) related to past poor dietary habits and physical inactivity as evidenced by patient w/ planned  sleeve surgery following dietary guidelines for continued weight loss.   NUTRITION INTERVENTION  Nutrition counseling (C-1) and education (E-2) to facilitate bariatric surgery goals.  Pre-Op Goals Reviewed with the Patient Nutrition: Healthy Eating Behaviors Switch to non-caloric, non-carbonated and non-caffeinated beverages such as  water, unsweetened tea, Crystal Light and zero calorie beverages (aim for 64 oz. per day) Cut out grazing between meals or at night  Find a protein shake you like Eat every 3-5 hours        Eliminate distractions while eating (TV, computer, reading, driving, texting) Take 91-47 minutes to eat a meal  Decrease high sugar foods/decrease high fat/fried foods Eliminate alcoholic beverages Increase protein intake (eggs, fish, chicken, yogurt) before surgery Eat non starchy vegetables 2 times a day 7 days a week Eat complex carbohydrates such as whole grains and fruits   Behavioral Modification: Physical Activity Increase my usual daily activity (use stairs, park farther, etc.) Engage in _________Walking______________  activity  ____20___ minutes ___2___ times per week  Other:    _________________________________________________________________     Problem Solving I will think about my usual eating patterns and how to tweak them How can my friends and family support me Barriers to starting my changes Learn and understand appetite verses hunger   Healthy Coping Allow for ___________ activities per week to help me manage stress Reframe negative thoughts I will keep a picture of someone or something that is my inspiration & look at it daily   Monitoring  Weigh myself once a week  Measure my progress by monitoring how my clothes fit Keep a food record of what I eat  and drink for the next ________ (time period) Take pictures of what I eat and drink for the next ________ (time period) Use an app to count steps/day for the next_______ (time period) Measure my  progress such as increased energy and more restful sleep Monitor your acid reflux and bowel habits, are they getting better?   *Goals that are bolded indicate the pt would like to start working towards these  Pre-Op Goals Progress & New Goals Continue: increase physical activity Continue: eliminate alcoholic beverages New: eat throughout the day.  Handouts Provided Include    Learning Style & Readiness for Change Teaching method utilized: Visual & Auditory  Demonstrated degree of understanding via: Teach Back  Readiness Level: contemplative Barriers to learning/adherence to lifestyle change: busy schedule; single mother  RD's Notes for next Visit  Patient progress toward chosen goals.   MONITORING & EVALUATION Dietary intake, weekly physical activity, body weight, and pre-op goals in 1 month.   Next Steps  Patient is to return to [facility] in 2 weeks for next SWL visit.

## 2022-06-01 NOTE — Progress Notes (Signed)
   Cardiology Clinic Note   Date: 06/04/2022 ID: Jenna Blair, DOB 1985-10-11, MRN [id number]  Primary Cardiologist:  Maisie Fus, MD  Patient Profile    Jenna Blair is a 37 y.o. female who presents to the clinic today for 1 month follow up.   Past medical history significant for: Chest pain/shortness of breath.  Migraines. OSA.  No CPAP since T&A.  Obesity.    History of Present Illness    Jenna Blair was first evaluated by Dr. Wyline Mood on 03/10/2022 for shortness of breath and chest pain following an ED visit with same complaints.  EKG in the ED showed normal sinus rhythm with LVH.  In the office patient had continued complaints of shortness of breath that was slightly improved.  No lower extremity edema, orthopnea or PND. Echo was ordered but has not been performed.   Today, patient reports shortness of breath is somewhat improved. She continues to have symptoms if she lays on her back but none if she is on her side. Her biggest concern today is choking. She reports since November she has noticed she feels food is getting hung up in her throat and she is having a hard time swallowing. If she uses liquid to try to get food down it will come out of her nose. Otherwise she is doing okay. No chest pain, tightness or pressure. No palpitations. She has chronic "puffiness" in bilateral ankles but no edema. 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      ROS: All other systems reviewed and are otherwise negative except as noted in History of Present Illness.  Studies Reviewed    ECG is not ordered today.   Physical Exam    VS:  BP 106/78   Pulse 78   Ht 5' (1.524 m)   Wt 242 lb (109.8 kg)   SpO2 98%   BMI 47.26 kg/m  , BMI Body mass index is 47.26 kg/m.  GEN: Well nourished, well developed, in no acute distress. Neck: No JVD or carotid bruits. Cardiac:  RRR. No murmurs. No rubs or gallops.    Respiratory:  Respirations regular and unlabored. Clear to auscultation without rales, wheezing or rhonchi. GI: Soft, nontender, nondistended. Extremities: Radials/DP/PT 2+ and equal bilaterally. No clubbing or cyanosis. No edema.  Skin: Warm and dry, no rash. Neuro: Strength intact.  Assessment & Plan    Chest pain/shortness of breath. Patient denies chest pain. She has continued shortness of breath with laying on her back. She thought she was coming in today for her echo. Will have that scheduled to complete her evaluation.  Choking. Patient reports since November 2023 she feels food is getting hung up in her throat. If she tries to get the food down with liquid it will come out of her nose. Will refer to ENT.   Disposition: Schedule echo. Refer to ENT. Return in 3 months or sooner as needed.          Signed, Etta Grandchild. Kayte Borchard, DNP, NP-C

## 2022-06-04 ENCOUNTER — Ambulatory Visit: Payer: 59 | Attending: Internal Medicine | Admitting: Student

## 2022-06-04 ENCOUNTER — Encounter: Payer: Self-pay | Admitting: Student

## 2022-06-04 VITALS — BP 106/78 | HR 78 | Ht 60.0 in | Wt 242.0 lb

## 2022-06-04 DIAGNOSIS — W44F3XA Food entering into or through a natural orifice, initial encounter: Secondary | ICD-10-CM | POA: Diagnosis not present

## 2022-06-04 DIAGNOSIS — R0602 Shortness of breath: Secondary | ICD-10-CM

## 2022-06-04 DIAGNOSIS — T17320A Food in larynx causing asphyxiation, initial encounter: Secondary | ICD-10-CM

## 2022-06-04 NOTE — Patient Instructions (Signed)
Medication Instructions:  Your physician recommends that you continue on your current medications as directed. Please refer to the Current Medication list given to you today.  *If you need a refill on your cardiac medications before your next appointment, please call your pharmacy*   Lab Work: NONE If you have labs (blood work) drawn today and your tests are completely normal, you will receive your results only by: MyChart Message (if you have MyChart) OR A paper copy in the mail If you have any lab test that is abnormal or we need to change your treatment, we will call you to review the results.   Testing/Procedures: NONE   Follow-Up: At [facility], you and your health needs are our priority.  As part of our continuing mission to provide you with exceptional heart care, we have created designated Provider Care Teams.  These Care Teams include your primary Cardiologist (physician) and Advanced Practice Providers (APPs -  Physician Assistants and Nurse Practitioners) who all work together to provide you with the care you need, when you need it.  We recommend signing up for the patient portal called "MyChart".  Sign up information is provided on this After Visit Summary.  MyChart is used to connect with patients for Virtual Visits (Telemedicine).  Patients are able to view lab/test results, encounter notes, upcoming appointments, etc.  Non-urgent messages can be sent to your provider as well.   To learn more about what you can do with MyChart, go to [email or web address].    Your next appointment:   3 month(s)  Provider:   Maisie Fus, MD

## 2022-06-07 ENCOUNTER — Encounter: Payer: Self-pay | Admitting: Dietician

## 2022-06-07 ENCOUNTER — Encounter: Payer: 59 | Attending: Surgery | Admitting: Dietician

## 2022-06-07 VITALS — Ht 60.0 in | Wt 242.6 lb

## 2022-06-07 DIAGNOSIS — E669 Obesity, unspecified: Secondary | ICD-10-CM | POA: Diagnosis present

## 2022-06-07 NOTE — Progress Notes (Signed)
Supervised Weight Loss Visit Bariatric Nutrition Education Appt Start Time: 9:00   End Time: 9:17  Planned surgery: Sleeve Gastrectomy  Pt expectation of surgery: to be 180-190; and health reasons  Referral stated Supervised Weight Loss (SWL) visits needed: 12  2 out of 12 SWL Appointments   NUTRITION ASSESSMENT   Anthropometrics  Start weight at [facility]: 241.1 lbs (date: 05/10/2022)  Height: 60 in Weight today: 242.6 lb BMI: 47.38 kg/m2     Clinical  History of weight loss medication used: not started due to availability Medical hx: sleep apnea, obesity, PTSD Medications: escitalopram, hydrOXYzine, ibuprofen, LORazepam, melatonin, REXULTI Labs: cholesterol 207; LDL 145; A1c 5.9; Vit D 8.1 Notable signs/symptoms: none noted Any previous deficiencies? No  Lifestyle & Dietary Hx  Pt states she has not had a day off in the past two weeks, stating she is taking on field trips and trying to move more. Pt states she is working on not drinking alcohol during the week, stating if she does, it is only once during the week. Pt states she is trying to get a snack in during the day.  Estimated daily fluid intake: 36-48 oz Supplements: none Current average weekly physical activity: walking at work; ADLs;   24-Hr Dietary Recall First Meal: skip or bacon egg and cheese or cheese grits or bagels or cereal Snack: pretzels or yogurt Second Meal: skip or salad or burger or sub or chicken wings Snack: maybe pretzels or yogurt or chips or granola bar Third Meal: spaghetti or hamburgers or hot dog or steak salad Snack: chips and dip some times or something simple Beverages: water, lemonade or cheerwine or sprite or ginger ale  Estimated Energy Needs Calories: 1500  NUTRITION DIAGNOSIS  Overweight/obesity ([place]-3.3) related to past poor dietary habits and physical inactivity as evidenced by patient w/ planned sleeve surgery following dietary guidelines for continued weight loss.  NUTRITION  INTERVENTION  Nutrition counseling (C-1) and education (E-2) to facilitate bariatric surgery goals.  Encouraged patient to honor their body's internal hunger and fullness cues.  Throughout the day, check in mentally and rate hunger. Stop eating when satisfied not full regardless of how much food is left on the plate.  Get more if still hungry 20-30 minutes later.  The key is to honor satisfaction so throughout the meal, rate fullness factor and stop when comfortably satisfied not physically full. The key is to honor hunger and fullness without any feelings of guilt or shame.  Pay attention to what the internal cues are, rather than any external factors. This will enhance the confidence you have in listening to your own body and following those internal cues enabling you to increase how often you eat when you are hungry not out of appetite and stop when you are satisfied not full.  Encouraged pt to continue to drink a minium 64 fluid ounces with half being plain water to satisfy proper hydration. Encouraged pt to avoid drinking with her meals. Discussed the importance of making room for food during meals and snacks. Encouraged pt to eat at scheduled meal and snack times and discussed the importance of not grazing. Encouraged pt to continue to reduce alcoholic beverages.  Pre-Op Goals Progress & New Goals Continue: increase physical activity Continue: eliminate alcoholic beverages Continue: avoid skipping meals: aim for 3 meals a day with 2-3 snacks if needed; eat every 3-5 hours. New: practice not drinking with meals; aim for 64 oz of fluids between meals and snacks.  Handouts Provided Include  Goals  printed out  Learning Style & Readiness for Change Teaching method utilized: Visual & Auditory  Demonstrated degree of understanding via: Teach Back  Readiness Level: contemplative Barriers to learning/adherence to lifestyle change: busy schedule; single mother  RD's Notes for next Visit  Patient  progress toward chosen goals.  MONITORING & EVALUATION Dietary intake, weekly physical activity, body weight, and pre-op goals in 1 month.   Next Steps  Patient is to return to [facility] in 2 weeks for next SWL visit.

## 2022-06-22 ENCOUNTER — Encounter: Payer: Self-pay | Admitting: Dietician

## 2022-06-22 ENCOUNTER — Encounter: Payer: 59 | Attending: Surgery | Admitting: Dietician

## 2022-06-22 VITALS — Ht 60.0 in | Wt 244.0 lb

## 2022-06-22 DIAGNOSIS — E669 Obesity, unspecified: Secondary | ICD-10-CM | POA: Insufficient documentation

## 2022-06-22 NOTE — Progress Notes (Signed)
Supervised Weight Loss Visit Bariatric Nutrition Education Appt Start Time: 9:15   End Time: 9:38  Planned surgery: Sleeve Gastrectomy  Pt expectation of surgery: to be 180-190; and health reasons  Referral stated Supervised Weight Loss (SWL) visits needed: 12  3 out of 12 SWL Appointments   NUTRITION ASSESSMENT   Anthropometrics  Start weight at [facility]: 241.1 lbs (date: 05/10/2022)  Height: 60 in Weight today: 244 lb BMI: 47.65 kg/m2     Clinical  History of weight loss medication used: not started due to availability Medical hx: sleep apnea, obesity, PTSD Medications: escitalopram, hydrOXYzine, ibuprofen, LORazepam, melatonin, REXULTI Labs: cholesterol 207; LDL 145; A1c 5.9; Vit D 8.1 Notable signs/symptoms: none noted Any previous deficiencies? No  Lifestyle & Dietary Hx  Pt states her fluid intake has picked up a lot, stating she was at the beach, so she drank water and a little bit of alcohol, stating she had more energy when she did not drink as much, stating she liked that. Pt states she walked a lot at the beach. Pt states it has been difficult to avoid drinking with her her meals and snacks. Pt states she has a referral to go the a ENT (Ear Nose Throat) doctors appointment, stating she is having a hard time swallowing her foods. Pt states she is choking when she eats. Pt states while on vacations she did better eating throughout the day.  Estimated daily fluid intake: 40-48 oz Supplements: none Current average weekly physical activity: ADLs at work, on her feet all day; Youtube workout 2 days a week, 30-45 minutes.  24-Hr Dietary Recall First Meal: skip or bacon egg and cheese or cheese grits or bagels or cereal Snack: pretzels or yogurt Second Meal: skip or salad or burger or sub or chicken wings Snack: maybe pretzels or yogurt or chips or granola bar Third Meal: spaghetti or hamburgers or hot dog or steak salad Snack: chips and dip some times or something  simple Beverages: water, lemonade or cheerwine or sprite or ginger ale  Estimated Energy Needs Calories: 1500  NUTRITION DIAGNOSIS  Overweight/obesity ([place]-3.3) related to past poor dietary habits and physical inactivity as evidenced by patient w/ planned sleeve surgery following dietary guidelines for continued weight loss.  NUTRITION INTERVENTION  Nutrition counseling (C-1) and education (E-2) to facilitate bariatric surgery goals.  Encouraged patient to honor their body's internal hunger and fullness cues.  Throughout the day, check in mentally and rate hunger. Stop eating when satisfied not full regardless of how much food is left on the plate.  Get more if still hungry 20-30 minutes later.  The key is to honor satisfaction so throughout the meal, rate fullness factor and stop when comfortably satisfied not physically full. The key is to honor hunger and fullness without any feelings of guilt or shame.  Pay attention to what the internal cues are, rather than any external factors. This will enhance the confidence you have in listening to your own body and following those internal cues enabling you to increase how often you eat when you are hungry not out of appetite and stop when you are satisfied not full.  Encouraged pt to continue to drink a minium 64 fluid ounces with half being plain water to satisfy proper hydration. Encouraged pt to avoid drinking with her meals. Discussed the importance of making room for food during meals and snacks. Encouraged pt to eat at scheduled meal and snack times and discussed the importance of not grazing. Encouraged pt to  continue to reduce alcoholic beverages. Physical Activity: Aim for 150 minutes of physical activity weekly. Regular physical activity promotes overall health-including helping to reduce risk for heart disease and diabetes, promoting mental health, and helping [place] sleep better.   Pre-Op Goals Progress & New Goals Continue: increase physical  activity Continue: eliminate alcoholic beverages Re-engage: avoid skipping meals: aim for 3 meals a day with 2-3 snacks if needed; eat every 3-5 hours. Continue: practice not drinking with meals; aim for 64 oz of fluids between meals and snacks. New: Youtube workout 2 days a week, 30-45 minutes.  Handouts Provided Include  Health Benefits of Physical Activity  Learning Style & Readiness for Change Teaching method utilized: Visual & Auditory  Demonstrated degree of understanding via: Teach Back  Readiness Level: contemplative Barriers to learning/adherence to lifestyle change: busy schedule; single mother  RD's Notes for next Visit  Patient progress toward chosen goals.  MONITORING & EVALUATION Dietary intake, weekly physical activity, body weight, and pre-op goals in 1 month.   Next Steps  Patient is to return to [facility] in 2 weeks for next SWL visit.

## 2022-07-06 ENCOUNTER — Ambulatory Visit (INDEPENDENT_AMBULATORY_CARE_PROVIDER_SITE_OTHER): Payer: 59 | Admitting: Licensed Clinical Social Worker

## 2022-07-06 DIAGNOSIS — Z8659 Personal history of other mental and behavioral disorders: Secondary | ICD-10-CM

## 2022-07-06 DIAGNOSIS — F331 Major depressive disorder, recurrent, moderate: Secondary | ICD-10-CM
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
## 2022-07-08 ENCOUNTER — Ambulatory Visit (HOSPITAL_COMMUNITY): Payer: 59 | Attending: Student

## 2022-07-08 DIAGNOSIS — R0602 Shortness of breath: Secondary | ICD-10-CM | POA: Diagnosis present

## 2022-07-08 LAB — ECHOCARDIOGRAM COMPLETE
Area-P 1/2: 3.45 cm2
S' Lateral: 2.8 cm

## 2022-07-13 ENCOUNTER — Encounter: Payer: Self-pay | Admitting: Dietician

## 2022-07-13 ENCOUNTER — Encounter: Payer: 59 | Attending: Surgery | Admitting: Dietician

## 2022-07-13 VITALS — Ht 60.0 in | Wt 242.3 lb

## 2022-07-13 DIAGNOSIS — E669 Obesity, unspecified: Secondary | ICD-10-CM | POA: Insufficient documentation

## 2022-07-13 NOTE — Progress Notes (Signed)
Supervised Weight Loss Visit Bariatric Nutrition Education Appt Start Time: 10:58   End Time: 11:18  Planned surgery: Sleeve Gastrectomy  Pt expectation of surgery: to be 180-190; and health reasons  Referral stated Supervised Weight Loss (SWL) visits needed: 12  4 out of 12 SWL Appointments   NUTRITION ASSESSMENT   Anthropometrics  Start weight at [facility]: 241.1 lbs (date: 05/10/2022)  Height: 60 in Weight today: 242.3 lb BMI: 47.32 kg/m2     Clinical  History of weight loss medication used: not started due to availability Medical hx: sleep apnea, obesity, PTSD Medications: escitalopram, hydrOXYzine, ibuprofen, LORazepam, melatonin, REXULTI Labs: cholesterol 207; LDL 145; A1c 5.9; Vit D 8.1 Notable signs/symptoms: none noted Any previous deficiencies? No  Lifestyle & Dietary Hx  Pt states she is not taking a vitamin D supplement Pt states she has been outside more, going to [employer] since school has been released, stating that hill got her. Pt states she will start going to the [facility] and use the pool with her friend.  Pt states she had her psychology visit, stating he has cleared her. Pt states she has been doing better with eating throughout the day. Pt states she has an appointment with an ENT for her swallowing, stating she has a hard time not drinking with her meals.  Estimated daily fluid intake: 40-48 oz Supplements: none Current average weekly physical activity: ADLs at work, on her feet all day; Youtube workout 2 days a week, 30-45 minutes.  24-Hr Dietary Recall First Meal: skip or bacon egg and cheese or cheese grits or bagels or cereal Snack: pretzels or yogurt Second Meal: skip or salad or burger or sub or chicken wings Snack: maybe pretzels or yogurt or chips or granola bar Third Meal: spaghetti or hamburgers or hot dog or steak salad Snack: chips and dip some times or something simple Beverages: water, lemonade or cheerwine or sprite or ginger  ale  Estimated Energy Needs Calories: 1500  NUTRITION DIAGNOSIS  Overweight/obesity ([place]-3.3) related to past poor dietary habits and physical inactivity as evidenced by patient w/ planned sleeve surgery following dietary guidelines for continued weight loss.  NUTRITION INTERVENTION  Nutrition counseling (C-1) and education (E-2) to facilitate bariatric surgery goals.  Encouraged patient to honor their body's internal hunger and fullness cues.  Throughout the day, check in mentally and rate hunger. Stop eating when satisfied not full regardless of how much food is left on the plate.  Get more if still hungry 20-30 minutes later.  The key is to honor satisfaction so throughout the meal, rate fullness factor and stop when comfortably satisfied not physically full. The key is to honor hunger and fullness without any feelings of guilt or shame.  Pay attention to what the internal cues are, rather than any external factors. This will enhance the confidence you have in listening to your own body and following those internal cues enabling you to increase how often you eat when you are hungry not out of appetite and stop when you are satisfied not full.  Encouraged pt to continue to drink a minium 64 fluid ounces with half being plain water to satisfy proper hydration. Encouraged pt to avoid drinking with her meals. Discussed the importance of making room for food during meals and snacks. Encouraged pt to eat at scheduled meal and snack times and discussed the importance of not grazing. Encouraged pt to continue to reduce alcoholic beverages. Physical Activity: Aim for 150 minutes of physical activity weekly. Regular physical  activity promotes overall health-including helping to reduce risk for heart disease and diabetes, promoting mental health, and helping [place] sleep better.   Pre-Op Goals Progress & New Goals Continue: increase physical activity Continue: eliminate alcoholic beverages Re-engage: avoid  skipping meals: aim for 3 meals a day with 2-3 snacks if needed; eat every 3-5 hours. Continue: practice not drinking with meals; aim for 64 oz of fluids between meals and snacks. Re-engage: outside physical activity instead of indoor Youtube videos. New: take vitamin D3 (over the counter) or ask surgeons office if you need a prescription. Outside activity will help as well.  Handouts Provided Include  Meal Ideas Handout  Learning Style & Readiness for Change Teaching method utilized: Visual & Auditory  Demonstrated degree of understanding via: Teach Back  Readiness Level: contemplative Barriers to learning/adherence to lifestyle change: busy schedule; single mother  RD's Notes for next Visit  Patient progress toward chosen goals.  MONITORING & EVALUATION Dietary intake, weekly physical activity, body weight, and pre-op goals in 1 month.   Next Steps  Patient is to return to [facility] in 2 weeks for next SWL visit.

## 2022-07-28 ENCOUNTER — Other Ambulatory Visit (HOSPITAL_COMMUNITY): Payer: Self-pay | Admitting: Otolaryngology

## 2022-07-28 DIAGNOSIS — R1314 Dysphagia, pharyngoesophageal phase: Secondary | ICD-10-CM

## 2022-08-02 ENCOUNTER — Ambulatory Visit: Payer: 59 | Admitting: Dietician

## 2022-08-06 ENCOUNTER — Other Ambulatory Visit (HOSPITAL_COMMUNITY): Payer: Self-pay | Admitting: Otolaryngology

## 2022-08-06 ENCOUNTER — Ambulatory Visit (HOSPITAL_COMMUNITY)
Admission: RE | Admit: 2022-08-06 | Discharge: 2022-08-06 | Disposition: A | Discharge status: Home / Self Care | Payer: 59 | Source: Ambulatory Visit | Attending: Otolaryngology | Admitting: Otolaryngology

## 2022-08-06 DIAGNOSIS — R1314 Dysphagia, pharyngoesophageal phase: Secondary | ICD-10-CM

## 2022-08-11 ENCOUNTER — Encounter: Payer: No Typology Code available for payment source | Attending: Surgery | Admitting: Skilled Nursing Facility1

## 2022-08-11 ENCOUNTER — Encounter: Payer: Self-pay | Admitting: Skilled Nursing Facility1

## 2022-08-11 NOTE — Progress Notes (Signed)
Supervised Weight Loss Visit Bariatric Nutrition Education  Planned surgery: Sleeve Gastrectomy  Pt expectation of surgery: to be 180-190; and health reasons  Referral stated Supervised Weight Loss (SWL) visits needed: 12  5 out of 12 SWL Appointments   NUTRITION ASSESSMENT   Anthropometrics  Start weight at [facility]: 241.1 lbs (date: 05/10/2022)  Height: 60 in Weight today: 236 lb BMI: 46.09 kg/m2     Clinical  History of weight loss medication used: not started due to availability Medical hx: sleep apnea, obesity, PTSD, anxiety Medications: escitalopram, hydrOXYzine, ibuprofen, LORazepam, melatonin, REXULTI Labs: cholesterol 207; LDL 145; A1c 5.9; Vit D 8.1 Notable signs/symptoms: none noted Any previous deficiencies? No  Lifestyle & Dietary Hx  Pt states she works with a talk therapist weekly.   Pt states she is not taking a vitamin D supplement stating she has been outside more: Dietitian advised due to her low levels she needs to replete in order to get the Vitamin D in an appropriate level and educated pt on the side effects of a low vitamin D.  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 Pt states she wakes with headaches very day.  Pt states she is working on eating breakfast stating that is hard. Pt states she tries toe at more grilled/air fried stuff.  Pt states she is proud of herself for doing something for herself. Pt states she is proud of herself for getting up and moving more.  Pt states she is motivated by her children.  Pt states she has a 4 month puppy that keeps her active.   Estimated daily fluid intake: 40-48 oz Supplements: none Current average weekly physical activity: ADLs at work, on her feet all day; Youtube workout 2 days a week, 30-45 minutes.  24-Hr Dietary Recall First Meal: skip or bacon egg and cheese or cheese grits or bagels or cereal Snack: dry cereal or fruit Second Meal: pork lime roast + broccoli and cheese + red potato and  onions Snack: maybe pretzels or yogurt or chips or granola bar Third Meal: taco bell Snack: chips and dip some times or something simple Beverages: water, lemonade or cheerwine or sprite or ginger ale  Estimated Energy Needs Calories: 1500  NUTRITION DIAGNOSIS  Overweight/obesity ([place]-3.3) related to past poor dietary habits and physical inactivity as evidenced by patient w/ planned sleeve surgery following dietary guidelines for continued weight loss.  NUTRITION INTERVENTION  Nutrition counseling (C-1) and education (E-2) to facilitate bariatric surgery goals.  Encouraged patient to honor their body's internal hunger and fullness cues.  Throughout the day, check in mentally and rate hunger. Stop eating when satisfied not full regardless of how much food is left on the plate.  Get more if still hungry 20-30 minutes later.  The key is to honor satisfaction so throughout the meal, rate fullness factor and stop when comfortably satisfied not physically full. The key is to honor hunger and fullness without any feelings of guilt or shame.  Pay attention to what the internal cues are, rather than any external factors. This will enhance the confidence you have in listening to your own body and following those internal cues enabling you to increase how often you eat when you are hungry not out of appetite and stop when you are satisfied not full.  Encouraged pt to continue to drink a minium 64 fluid ounces with half being plain water to satisfy proper hydration. Encouraged pt to avoid drinking with her meals. Discussed the importance of making room  for food during meals and snacks. Encouraged pt to eat at scheduled meal and snack times and discussed the importance of not grazing. Encouraged pt to continue to reduce alcoholic beverages. Physical Activity: Aim for 150 minutes of physical activity weekly. Regular physical activity promotes overall health-including helping to reduce risk for heart disease and  diabetes, promoting mental health, and helping [place] sleep better.  Vitamin D Repletion Sunlight Exposure: Spend time outdoors: Exposure to sunlight is a natural way for the body to produce vitamin D. Aim for around 10-30 minutes of sun exposure on the face, arms, and legs, at least twice a week. Time of day: Sun exposure is most effective when the sun is high in the sky (between 10 am and 3 pm). Dietary Sources: Fatty fish: Include salmon, mackerel, and sardines in your diet. Fortified foods: Consume vitamin D-fortified foods such as fortified milk, orange juice, and cereals. Eggs: Eat egg yolks, which naturally contain vitamin D. Supplements: Vitamin D supplements: vitamin D supplements dosage 50000 prescribed per week or 10000IU per day;  based on individual needs and the degree of deficiency. Types of supplements: Vitamin D3 (cholecalciferol) is often recommended as it is more effective at raising and maintaining vitamin D levels in the body. Regular Monitoring: Blood tests: Periodically monitor vitamin D levels through blood tests to assess progress and adjust supplementation if necessary. Healthy Lifestyle: Maintain a healthy diet: Ensure that the overall diet includes a balanced mix of nutrients to support overall health. Regular exercise: Physical activity can contribute to overall well-being, including bone health   Pre-Op Goals Progress & New Goals Continue: increase physical activity Continue: eliminate alcoholic beverages Re-engage: avoid skipping meals: aim for 3 meals a day with 2-3 snacks if needed; eat every 3-5 hours. Continue: practice not drinking with meals; aim for 64 oz of fluids between meals and snacks. Re-engage: outside physical activity instead of indoor Youtube videos. New/Continue: take vitamin D3 (over the counter) or ask surgeons office if you need a prescription. Outside activity will help as well. NEW: add in another bottle of water   Handouts Previously  Provided Include  Meal Ideas Handout  Learning Style & Readiness for Change Teaching method utilized: Visual & Auditory  Demonstrated degree of understanding via: Teach Back  Readiness Level: contemplative Barriers to learning/adherence to lifestyle change: busy schedule; single mother  RD's Notes for next Visit  Patient progress toward chosen goals.  MONITORING & EVALUATION Dietary intake, weekly physical activity, body weight, and pre-op goals   Next Steps  Patient is to return to [facility] in 1-2 weeks for next SWL visit.

## 2022-08-20 ENCOUNTER — Ambulatory Visit (HOSPITAL_COMMUNITY)
Admission: EM | Admit: 2022-08-20 | Discharge: 2022-08-20 | Disposition: A | Discharge status: Home / Self Care | Payer: 59 | Source: Home / Self Care

## 2022-08-20 DIAGNOSIS — S0081XA Abrasion of other part of head, initial encounter: Secondary | ICD-10-CM

## 2022-08-20 DIAGNOSIS — S80212A Abrasion, left knee, initial encounter: Secondary | ICD-10-CM

## 2022-08-20 DIAGNOSIS — S80211A Abrasion, right knee, initial encounter: Secondary | ICD-10-CM

## 2022-08-20 DIAGNOSIS — S0181XA Laceration without foreign body of other part of head, initial encounter: Secondary | ICD-10-CM | POA: Diagnosis not present

## 2022-08-20 DIAGNOSIS — Z23 Encounter for immunization: Secondary | ICD-10-CM

## 2022-08-20 DIAGNOSIS — W19XXXA Unspecified fall, initial encounter: Secondary | ICD-10-CM

## 2022-08-20 MED ORDER — IBUPROFEN 800 MG PO TABS
ORAL_TABLET | ORAL | Status: AC
  Filled 2022-08-20: qty 1

## 2022-08-20 MED ORDER — IBUPROFEN 800 MG PO TABS
800.0000 mg | ORAL_TABLET | Freq: Once | ORAL | Status: AC
  Administered 2022-08-20: 800 mg via ORAL

## 2022-08-20 MED ORDER — TETANUS-DIPHTH-ACELL PERTUSSIS 5-2.5-18.5 LF-MCG/0.5 IM SUSY
0.5000 mL | PREFILLED_SYRINGE | Freq: Once | INTRAMUSCULAR | Status: AC
  Administered 2022-08-20: 0.5 mL via INTRAMUSCULAR

## 2022-08-20 MED ORDER — LIDOCAINE-EPINEPHRINE 1 %-1:100000 IJ SOLN
INTRAMUSCULAR | Status: AC
  Filled 2022-08-20: qty 1

## 2022-08-20 MED ORDER — TETANUS-DIPHTH-ACELL PERTUSSIS 5-2.5-18.5 LF-MCG/0.5 IM SUSY
PREFILLED_SYRINGE | INTRAMUSCULAR | Status: AC
  Filled 2022-08-20: qty 0.5

## 2022-08-20 MED ORDER — IBUPROFEN 800 MG PO TABS: 800.0000 mg | ORAL_TABLET | Freq: Three times a day (TID) | ORAL | 0 refills | Status: DC

## 2022-08-20 NOTE — Discharge Instructions (Addendum)
We have placed 3 sutures in your forehead today.  Please return to clinic in 5 days to get these removed.  Do not get your sutures wet for the first 24 hours.  You can apply an antibacterial ointment like Neosporin to keep the area clean and dry.  You can also apply the antibacterial ointment to your abrasions on your knees and chin.  You will probably have generalized soreness and swelling on the areas that made impact in your fall.  You can take 800 mg of ibuprofen every 8 hours, I suggest taking this with food to prevent stomach upset.  Seek immediate care if you develop loss of consciousness, emesis, or extreme headache.

## 2022-08-20 NOTE — ED Triage Notes (Signed)
Pt tripped and fell and hit her head on the concrete. Pt has laceration beside her right eye brow above her nose. Bleeding controlled. She feels like her face is swollen. Denies loc. Concern for concussion.

## 2022-08-20 NOTE — ED Provider Notes (Signed)
[facility]    CSN: [id number] Arrival date & time: 08/20/22  1359      History   Chief Complaint Chief Complaint  Patient presents with   Laceration    HPI Jenna Blair is a 37 y.o. female.   Patient presents to clinic with a forehead laceration.  She tripped and fell, hitting her head on the concrete.  She denies any loss of consciousness or headache.  Laceration is approximately 2 cm long over the start of her right eyebrow.  Bleeding is controlled.  She is concerned about a concussion.  She denies any nausea or vomiting.  She has shallow abrasions to her bilateral knees and her chin.  Unsure when last Tdap was.  The history is provided by the patient and medical records.  Laceration   Past Medical History:  Diagnosis Date   Depression    Environmental allergies    Pre-diabetes    Sleep apnea    Uses C Pap nightly    Patient Active Problem List   Diagnosis Date Noted   MDD (major depressive disorder), single episode, severe , no psychosis (HCC) 06/23/2021   Major depressive disorder, single episode, severe without psychosis (HCC) 06/22/2021   PTSD (post-traumatic stress disorder) 06/22/2021   Sleep apnea 04/20/2017   Bleeding internal hemorrhoids 01/07/2017   Morbid obesity (HCC) 12/03/2014   OSA (obstructive sleep apnea) 10/22/2014    Past Surgical History:  Procedure Laterality Date   CESAREAN SECTION     x1   LEG SURGERY Right    Injury as a child   TONSILLECTOMY/ADENOIDECTOMY/TURBINATE REDUCTION Bilateral 04/20/2017   Procedure: TONSILLECTOMY/ADENOIDECTOMY AND BILATERAL TURBINATE REDUCTION;  Surgeon: Flo Shanks, MD;  Location: [facility];  Service: ENT;  Laterality: Bilateral;   TOOTH EXTRACTION      OB History     Gravida  1   Para      Term      Preterm      AB      Living  1      SAB      IAB      Ectopic      Multiple      Live Births  1            Home Medications    Prior to Admission medications    Medication Sig Start Date End Date Taking? Authorizing Provider  ibuprofen (ADVIL) 800 MG tablet Take 1 tablet (800 mg total) by mouth 3 (three) times daily. 08/20/22  Yes Rinaldo Ratel, [place] N, FNP  escitalopram (LEXAPRO) 10 MG tablet Take 1 tablet (10 mg total) by mouth daily. 06/28/21   Sarita Bottom, MD  hydrOXYzine (ATARAX) 25 MG tablet Take 1 tablet (25 mg total) by mouth 3 (three) times daily as needed for anxiety. 06/27/21   Sarita Bottom, MD  LORazepam (ATIVAN) 1 MG tablet Take 1 mg by mouth daily as needed for anxiety. 03/03/22   [provider]  melatonin 5 MG TABS Take 1 tablet (5 mg total) by mouth at bedtime. Patient not taking: Reported on 06/04/2022 06/27/21   Sarita Bottom, MD  REXULTI 1 MG TABS tablet Take 1 mg by mouth daily. 03/04/22   [provider]  traZODone (DESYREL) 50 MG tablet Take 100 mg by mouth at bedtime as needed for sleep. 03/03/22   [provider]  cetirizine (ZYRTEC) 10 MG tablet Take 1 tablet (10 mg total) by mouth 2 (two) times daily. Patient not taking: Reported on 08/03/2018  02/28/18 02/29/20  Eustace Moore, MD  ipratropium (ATROVENT) 0.03 % nasal spray Place 2 sprays into both nostrils every 12 (twelve) hours. Patient not taking: Reported on 08/06/2019  02/29/20  [provider]  loratadine (CLARITIN) 10 MG tablet Take 1 tablet (10 mg total) by mouth daily. Patient not taking: Reported on 08/06/2019 08/03/18 02/29/20  Brock Bad, MD    Family History Family History  Problem Relation Age of Onset   OCD Mother    Arthritis Mother    Healthy Father    Diabetes Maternal Aunt    Diabetes Paternal Aunt    Diabetes Other    Heart disease Other    Hyperlipidemia Other    Hypertension Other    Stroke Other    Thyroid disease Other     Social History Social History   Tobacco Use   Smoking status: Never    Passive exposure: Never   Smokeless tobacco: Never  Vaping Use   Vaping status: Never Used  Substance Use Topics    Alcohol use: Yes    Comment: occasional   Drug use: No     Allergies   Patient has no known allergies.   Review of Systems Review of Systems  Skin:  Positive for wound.     Physical Exam Triage Vital Signs ED Triage Vitals [08/20/22 1403]  Encounter Vitals Group     BP (!) 126/93     Systolic BP Percentile      Diastolic BP Percentile      Pulse Rate 87     Resp 17     Temp 98.9 F (37.2 C)     Temp src      SpO2 97 %     Weight      Height      Head Circumference      Peak Flow      Pain Score      Pain Loc      Pain Education      Exclude from Growth Chart    No data found.  Updated Vital Signs BP (!) 126/93   Pulse 87   Temp 98.9 F (37.2 C)   Resp 17   SpO2 97%   Visual Acuity Right Eye Distance:   Left Eye Distance:   Bilateral Distance:    Right Eye Near:   Left Eye Near:    Bilateral Near:     Physical Exam Vitals and nursing note reviewed.  Constitutional:      Appearance: Normal appearance.  HENT:     Head: Normocephalic.      Right Ear: External ear normal.     Left Ear: External ear normal.     Nose: Nose normal.     Mouth/Throat:     Mouth: Mucous membranes are moist.  Eyes:     General: No scleral icterus. Cardiovascular:     Rate and Rhythm: Normal rate.  Pulmonary:     Effort: Pulmonary effort is normal. No respiratory distress.  Skin:    General: Skin is warm and dry.  Neurological:     General: No focal deficit present.     Mental Status: She is alert and oriented to person, place, and time.  Psychiatric:        Mood and Affect: Mood normal.        Behavior: Behavior normal. Behavior is cooperative.      UC Treatments / Results  Labs (all labs ordered are listed, but only  abnormal results are displayed) Labs Reviewed - No data to display  EKG   Radiology No results found.  Procedures Laceration Repair  Date/Time: 08/20/2022 4:20 PM  Performed by: Riggins Cisek, [place] N, FNP Authorized by: Dorissa Stinnette,  [place] N, FNP   Consent:    Consent obtained:  Verbal   Consent given by:  Patient   Risks discussed:  Infection, need for additional repair, pain, poor cosmetic result and poor wound healing   Alternatives discussed:  No treatment and delayed treatment Universal protocol:    Procedure explained and questions answered to patient or proxy's satisfaction: yes     Relevant documents present and verified: yes     Test results available: yes     Imaging studies available: yes     Required blood products, implants, devices, and special equipment available: yes     Site/side marked: yes     Immediately prior to procedure, a time out was called: yes     Patient identity confirmed:  Verbally with patient Anesthesia:    Anesthesia method:  Local infiltration   Local anesthetic:  Procaine 1% WITH epi Laceration details:    Location:  Face   Face location:  R eyebrow   Length (cm):  2   Depth (mm):  2 Pre-procedure details:    Preparation:  Patient was prepped and draped in usual sterile fashion Exploration:    Hemostasis achieved with:  Direct pressure   Imaging outcome: foreign body not noted     Wound exploration: wound explored through full range of motion and entire depth of wound visualized     Contaminated: no   Treatment:    Area cleansed with:  Chlorhexidine   Amount of cleaning:  Standard Skin repair:    Repair method:  Sutures   Suture size:  6-0   Suture material:  Prolene   Suture technique:  Simple interrupted   Number of sutures:  3 Approximation:    Approximation:  Close Repair type:    Repair type:  Simple Post-procedure details:    Dressing:  Open (no dressing)   Procedure completion:  Tolerated well, no immediate complications  (including critical care time)  Medications Ordered in UC Medications  ibuprofen (ADVIL) tablet 800 mg (has no administration in time range)  Tdap (BOOSTRIX) injection 0.5 mL (0.5 mLs Intramuscular Given 08/20/22 1604)    Initial  Impression / Assessment and Plan / UC Course  I have reviewed the triage vital signs and the nursing notes.  Pertinent labs & imaging results that were available during my care of the patient were reviewed by me and considered in my medical decision making (see chart for details).  Vitals in triage reviewed, patient is hemodynamically stable.  GCS is 15, alert and oriented and no loss of consciousness.  2 cm laceration to forehead that was repaired with 3 6-0 sutures, see procedure note for further details.  Tdap updated.  Abrasions cleansed to knees.  Encouraged to return in 5 days for removal.  Plan of care, follow-up care and return precautions given, no questions at this time.     Final Clinical Impressions(s) / UC Diagnoses   Final diagnoses:  Laceration of forehead, initial encounter  Abrasion of knee, bilateral  Fall, initial encounter  Abrasion of chin, initial encounter     Discharge Instructions      We have placed 3 sutures in your forehead today.  Please return to clinic in 5 days to get these removed.  Do not  get your sutures wet for the first 24 hours.  You can apply an antibacterial ointment like Neosporin to keep the area clean and dry.  You can also apply the antibacterial ointment to your abrasions on your knees and chin.  You will probably have generalized soreness and swelling on the areas that made impact in your fall.  You can take 800 mg of ibuprofen every 8 hours, I suggest taking this with food to prevent stomach upset.  Seek immediate care if you develop loss of consciousness, emesis, or extreme headache.       ED Prescriptions     Medication Sig Dispense Auth. Provider   ibuprofen (ADVIL) 800 MG tablet Take 1 tablet (800 mg total) by mouth 3 (three) times daily. 21 tablet Ariella Voit, [place] N, [place]      PDMP not reviewed this encounter.   Swannie Milius, [place] N, [place] 08/20/22 [phone]

## 2022-08-24 ENCOUNTER — Ambulatory Visit: Payer: 59 | Admitting: Dietician

## 2022-08-25 ENCOUNTER — Ambulatory Visit (HOSPITAL_COMMUNITY)
Admission: EM | Admit: 2022-08-25 | Discharge: 2022-08-25 | Disposition: A | Discharge status: Home / Self Care | Payer: No Typology Code available for payment source | Attending: Internal Medicine | Admitting: Internal Medicine

## 2022-08-25 DIAGNOSIS — Z4802 Encounter for removal of sutures: Secondary | ICD-10-CM

## 2022-08-25 NOTE — ED Triage Notes (Signed)
Patient seen 08/25/2022.  Patient seen at this location and 3 sutures placed.

## 2022-08-25 NOTE — ED Triage Notes (Signed)
[place] G[place], NP evaluated wound

## 2022-08-26 ENCOUNTER — Encounter: Payer: Self-pay | Admitting: Dietician

## 2022-08-26 ENCOUNTER — Encounter: Payer: No Typology Code available for payment source | Attending: Surgery | Admitting: Dietician

## 2022-08-26 VITALS — Ht 60.0 in | Wt 237.4 lb

## 2022-08-26 DIAGNOSIS — E669 Obesity, unspecified: Secondary | ICD-10-CM | POA: Insufficient documentation

## 2022-08-26 NOTE — Progress Notes (Signed)
Supervised Weight Loss Visit Bariatric Nutrition Education  Planned surgery: Sleeve Gastrectomy  Pt expectation of surgery: to be 180-190; and health reasons  Referral stated Supervised Weight Loss (SWL) visits needed: 12  6 out of 12 SWL Appointments   NUTRITION ASSESSMENT   Anthropometrics  Start weight at [facility]: 241.1 lbs (date: 05/10/2022)  Height: 60 in Weight today: 237.4 lb BMI: 46.36 kg/m2     Clinical  History of weight loss medication used: not started due to availability Medical hx: sleep apnea, obesity, PTSD, anxiety Medications: escitalopram, hydrOXYzine, ibuprofen, LORazepam, melatonin, REXULTI Labs: cholesterol 207; LDL 145; A1c 5.9; Vit D 8.1 Notable signs/symptoms: none noted Any previous deficiencies? No  Lifestyle & Dietary Hx  Pt states she has been outside a lot more, stating she walks her dog. Pt states she is doing less Youtube work outs and less in the yard. Pt states she is eating 3 meals a day, stating it might not be much, but she states she is getting something each meal or snack. Pt states she is drinking more since she is outside more. Pt states she may have some acid reflux, stating she was told to chew her food well and sit up while eating. Pt states she is doing better avoiding sweets. Pt states she has mostly done chipotle for dinner instead of burgers.  Estimated daily fluid intake: 48-64 oz Supplements: none Current average weekly physical activity: ADLs at work, on her feet all day; walking the dog; Youtube workout 2 days a week, 30-45 minutes.  24-Hr Dietary Recall First Meal: skip or bacon egg and cheese or cheese grits or bagels or cereal Snack: dry cereal or fruit Second Meal: pork lime roast + broccoli and cheese + red potato and onions Snack: maybe pretzels or yogurt or chips or granola bar Third Meal: taco bell; chicken tenders; chipotle  Snack: chips and dip some times or something simple Beverages: water, lemonade or cheerwine  or sprite or ginger ale  Estimated Energy Needs Calories: 1500  NUTRITION DIAGNOSIS  Overweight/obesity ([place]-3.3) related to past poor dietary habits and physical inactivity as evidenced by patient w/ planned sleeve surgery following dietary guidelines for continued weight loss.  NUTRITION INTERVENTION  Nutrition counseling (C-1) and education (E-2) to facilitate bariatric surgery goals.  Encouraged patient to honor their body's internal hunger and fullness cues.  Throughout the day, check in mentally and rate hunger. Stop eating when satisfied not full regardless of how much food is left on the plate.  Get more if still hungry 20-30 minutes later.  The key is to honor satisfaction so throughout the meal, rate fullness factor and stop when comfortably satisfied not physically full. The key is to honor hunger and fullness without any feelings of guilt or shame.  Pay attention to what the internal cues are, rather than any external factors. This will enhance the confidence you have in listening to your own body and following those internal cues enabling you to increase how often you eat when you are hungry not out of appetite and stop when you are satisfied not full.  Encouraged pt to continue to drink a minium 64 fluid ounces with half being plain water to satisfy proper hydration. Encouraged pt to avoid drinking with her meals. Discussed the importance of making room for food during meals and snacks. Encouraged pt to eat at scheduled meal and snack times and discussed the importance of not grazing. Encouraged pt to continue to reduce alcoholic beverages. Physical Activity: Aim for 150  minutes of physical activity weekly. Regular physical activity promotes overall health-including helping to reduce risk for heart disease and diabetes, promoting mental health, and helping [place] sleep better.  Vitamin D Repletion Sunlight Exposure: Spend time outdoors: Exposure to sunlight is a natural way for the body  to produce vitamin D. Aim for around 10-30 minutes of sun exposure on the face, arms, and legs, at least twice a week. Time of day: Sun exposure is most effective when the sun is high in the sky (between 10 am and 3 pm). Dietary Sources: Fatty fish: Include salmon, mackerel, and sardines in your diet. Fortified foods: Consume vitamin D-fortified foods such as fortified milk, orange juice, and cereals. Eggs: Eat egg yolks, which naturally contain vitamin D. Supplements: Vitamin D supplements: vitamin D supplements dosage 50000 prescribed per week or 10000IU per day;  based on individual needs and the degree of deficiency. Types of supplements: Vitamin D3 (cholecalciferol) is often recommended as it is more effective at raising and maintaining vitamin D levels in the body. Regular Monitoring: Blood tests: Periodically monitor vitamin D levels through blood tests to assess progress and adjust supplementation if necessary. Healthy Lifestyle: Maintain a healthy diet: Ensure that the overall diet includes a balanced mix of nutrients to support overall health. Regular exercise: Physical activity can contribute to overall well-being, including bone health   Pre-Op Goals Progress & New Goals Continue: increase physical activity Continue: eliminate alcoholic beverages Continue: avoid skipping meals: aim for 3 meals a day with 2-3 snacks if needed; eat every 3-5 hours. Continue: practice not drinking with meals; aim for 64 oz of fluids between meals and snacks. Continue: outside physical activity instead of indoor Youtube videos. Continue: take vitamin D3 (over the counter) or ask surgeons office if you need a prescription. Outside activity will help as well. Continue: add in another bottle of water; aim for 64 ounces per day. New: on lunch break walk around the parking lot at least twice; walk the dog twice a day when you don't have work.  Handouts Previously Provided Include    Learning Style &  Readiness for Change Teaching method utilized: Visual & Auditory  Demonstrated degree of understanding via: Teach Back  Readiness Level: contemplative Barriers to learning/adherence to lifestyle change: busy schedule; single mother  RD's Notes for next Visit  Patient progress toward chosen goals.  MONITORING & EVALUATION Dietary intake, weekly physical activity, body weight, and pre-op goals   Next Steps  Patient is to return to [facility] in 1-2 weeks for next SWL visit.

## 2022-09-06 ENCOUNTER — Ambulatory Visit: Payer: 59 | Admitting: Internal Medicine

## 2022-09-09 ENCOUNTER — Ambulatory Visit: Payer: No Typology Code available for payment source | Admitting: Dietician

## 2022-09-14 ENCOUNTER — Encounter: Payer: No Typology Code available for payment source | Admitting: Dietician

## 2022-09-14 ENCOUNTER — Encounter: Payer: Self-pay | Admitting: Dietician

## 2022-09-14 VITALS — Ht 60.0 in | Wt 235.6 lb

## 2022-09-14 DIAGNOSIS — E669 Obesity, unspecified: Secondary | ICD-10-CM | POA: Diagnosis not present

## 2022-09-14 NOTE — Progress Notes (Signed)
Supervised Weight Loss Visit Bariatric Nutrition Education  Planned surgery: Sleeve Gastrectomy  Pt expectation of surgery: to be 180-190; and health reasons  Referral stated Supervised Weight Loss (SWL) visits needed: 12  7 out of 12 SWL Appointments   NUTRITION ASSESSMENT   Anthropometrics  Start weight at [facility]: 241.1 lbs (date: 05/10/2022)  Height: 60 in Weight today: 235.6 lb BMI: 46.01 kg/m2     Clinical  History of weight loss medication used: not started due to availability Medical hx: sleep apnea, obesity, PTSD, anxiety Medications: escitalopram, hydrOXYzine, ibuprofen, LORazepam, melatonin, REXULTI Labs: cholesterol 207; LDL 145; A1c 5.9; Vit D 8.1 Notable signs/symptoms: none noted Any previous deficiencies? No  Lifestyle & Dietary Hx  Pt states she has a workout partner to join her 1 or 2 times a week. Pt states she is practicing not drinking with meals. Pt agreeable to track her fluids and protein before next visit.  Estimated daily fluid intake: 48-64 oz Supplements: vit D Current average weekly physical activity: ADLs at work, on her feet all day; walking the dog; Youtube workout 2 days a week, 30-45 minutes.   24-Hr Dietary Recall First Meal: skip or bacon egg and cheese or cheese grits or bagels or cereal Snack: dry cereal or fruit Second Meal: pork lime roast + broccoli and cheese + red potato and onions Snack: maybe pretzels or yogurt or chips or granola bar Third Meal: taco bell; chicken tenders; chipotle  Snack: chips and dip some times or something simple Beverages: water, lemonade or cheerwine or sprite or ginger ale  Estimated Energy Needs Calories: 1500  NUTRITION DIAGNOSIS  Overweight/obesity ([place]-3.3) related to past poor dietary habits and physical inactivity as evidenced by patient w/ planned sleeve surgery following dietary guidelines for continued weight loss.  NUTRITION INTERVENTION  Nutrition counseling (C-1) and education (E-2) to  facilitate bariatric surgery goals.  Encouraged patient to honor their body's internal hunger and fullness cues.  Throughout the day, check in mentally and rate hunger. Stop eating when satisfied not full regardless of how much food is left on the plate.  Get more if still hungry 20-30 minutes later.  The key is to honor satisfaction so throughout the meal, rate fullness factor and stop when comfortably satisfied not physically full. The key is to honor hunger and fullness without any feelings of guilt or shame.  Pay attention to what the internal cues are, rather than any external factors. This will enhance the confidence you have in listening to your own body and following those internal cues enabling you to increase how often you eat when you are hungry not out of appetite and stop when you are satisfied not full.  Encouraged pt to continue to drink a minium 64 fluid ounces with half being plain water to satisfy proper hydration. Encouraged pt to avoid drinking with her meals. Discussed the importance of making room for food during meals and snacks. Encouraged pt to eat at scheduled meal and snack times and discussed the importance of not grazing. Encouraged pt to continue to reduce alcoholic beverages. Physical Activity: Aim for 150 minutes of physical activity weekly. Regular physical activity promotes overall health-including helping to reduce risk for heart disease and diabetes, promoting mental health, and helping [place] sleep better.  Vitamin D Repletion Sunlight Exposure: Spend time outdoors: Exposure to sunlight is a natural way for the body to produce vitamin D. Aim for around 10-30 minutes of sun exposure on the face, arms, and legs, at least twice a  week. Time of day: Sun exposure is most effective when the sun is high in the sky (between 10 am and 3 pm). Dietary Sources: Fatty fish: Include salmon, mackerel, and sardines in your diet. Fortified foods: Consume vitamin D-fortified foods such  as fortified milk, orange juice, and cereals. Eggs: Eat egg yolks, which naturally contain vitamin D. Supplements: Vitamin D supplements: vitamin D supplements dosage 50000 prescribed per week or 10000IU per day;  based on individual needs and the degree of deficiency. Types of supplements: Vitamin D3 (cholecalciferol) is often recommended as it is more effective at raising and maintaining vitamin D levels in the body. Regular Monitoring: Blood tests: Periodically monitor vitamin D levels through blood tests to assess progress and adjust supplementation if necessary. Healthy Lifestyle: Maintain a healthy diet: Ensure that the overall diet includes a balanced mix of nutrients to support overall health. Regular exercise: Physical activity can contribute to overall well-being, including bone health Reviewed food labels and how to track protein per serving on food labels and estimating ounces of meat to track protein intake.  Pre-Op Goals Progress & New Goals Continue: increase physical activity Continue: eliminate alcoholic beverages Continue: avoid skipping meals: aim for 3 meals a day with 2-3 snacks if needed; eat every 3-5 hours. New/Re-engage: practice not drinking with meals; aim for 64 oz of fluids between meals and snacks. Track your fluid Continue: outside physical activity instead of indoor Youtube videos. Continue: take vitamin D3 (over the counter) or ask surgeons office if you need a prescription. Outside activity will help as well. Continue: add in another bottle of water; aim for 64 ounces per day. Continue: on lunch break walk around the parking lot at least twice; walk the dog twice a day when you don't have work. Report back how it is going with workout partner. New: track your protein intake  Handouts Provided Include  Protein foods with protein value per volume/weight  Learning Style & Readiness for Change Teaching method utilized: Visual & Auditory  Demonstrated degree  of understanding via: Teach Back  Readiness Level: contemplative Barriers to learning/adherence to lifestyle change: busy schedule; single mother  RD's Notes for next Visit  Patient progress toward chosen goals.  MONITORING & EVALUATION Dietary intake, weekly physical activity, body weight, and pre-op goals   Next Steps  Patient is to return to [facility] in 1-2 weeks for next SWL visit.

## 2022-10-01 ENCOUNTER — Encounter: Payer: Self-pay | Admitting: Dietician

## 2022-10-01 ENCOUNTER — Encounter: Payer: No Typology Code available for payment source | Attending: Surgery | Admitting: Dietician

## 2022-10-01 VITALS — Ht 60.0 in | Wt 234.0 lb

## 2022-10-01 DIAGNOSIS — Z713 Dietary counseling and surveillance: Secondary | ICD-10-CM | POA: Insufficient documentation

## 2022-10-01 DIAGNOSIS — E669 Obesity, unspecified: Secondary | ICD-10-CM | POA: Insufficient documentation

## 2022-10-01 NOTE — Progress Notes (Signed)
Supervised Weight Loss Visit Bariatric Nutrition Education  Planned surgery: Sleeve Gastrectomy  Pt expectation of surgery: to be 180-190; and health reasons  Referral stated Supervised Weight Loss (SWL) visits needed: 12  8 out of 12 SWL Appointments   NUTRITION ASSESSMENT   Anthropometrics  Start weight at [facility]: 241.1 lbs (date: 05/10/2022)  Height: 60 in Weight today: 234.0 lb BMI: 45.70 kg/m2     Clinical  History of weight loss medication used: not started due to availability Medical hx: sleep apnea, obesity, PTSD, anxiety Medications: escitalopram, hydrOXYzine, ibuprofen, LORazepam, melatonin, REXULTI Labs: cholesterol 207; LDL 145; A1c 5.9; Vit D 8.1 Notable signs/symptoms: none noted Any previous deficiencies? No  Lifestyle & Dietary Hx  Pt states she is walking the dog, stating that her friend did not follow throught with goting to the gym with her. Pt states she is drinking more, stating she doesn't track it. Pt states she tries to drink 3 glasses (24 oz each) while at work. 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 Pt states she doesn't think she is tracking her protein correctly.  Estimated daily fluid intake: 48-64 oz Supplements: vit D Current average weekly physical activity: ADLs at work, on her feet all day; walking the dog daily; Youtube workout 2 days a week, 30-45 minutes.   24-Hr Dietary Recall First Meal: skip or bacon egg and cheese or cheese grits or bagels or cereal Snack: dry cereal or fruit Second Meal: pork lime roast + broccoli and cheese + red potato and onions Snack: maybe pretzels or yogurt or chips or granola bar Third Meal: taco bell; chicken tenders; chipotle  Snack: chips and dip some times or something simple Beverages: water, lemonade or cheerwine or sprite or ginger ale  Estimated Energy Needs Calories: 1500  NUTRITION DIAGNOSIS  Overweight/obesity ([place]-3.3) related to past poor dietary habits and physical  inactivity as evidenced by patient w/ planned sleeve surgery following dietary guidelines for continued weight loss.  NUTRITION INTERVENTION  Nutrition counseling (C-1) and education (E-2) to facilitate bariatric surgery goals.  Encouraged patient to honor their body's internal hunger and fullness cues.  Throughout the day, check in mentally and rate hunger. Stop eating when satisfied not full regardless of how much food is left on the plate.  Get more if still hungry 20-30 minutes later.  The key is to honor satisfaction so throughout the meal, rate fullness factor and stop when comfortably satisfied not physically full. The key is to honor hunger and fullness without any feelings of guilt or shame.  Pay attention to what the internal cues are, rather than any external factors. This will enhance the confidence you have in listening to your own body and following those internal cues enabling you to increase how often you eat when you are hungry not out of appetite and stop when you are satisfied not full.  Encouraged pt to continue to drink a minium 64 fluid ounces with half being plain water to satisfy proper hydration. Encouraged pt to avoid drinking with her meals. Discussed the importance of making room for food during meals and snacks. Encouraged pt to eat at scheduled meal and snack times and discussed the importance of not grazing. Encouraged pt to continue to reduce alcoholic beverages. Physical Activity: Aim for 150 minutes of physical activity weekly. Regular physical activity promotes overall health-including helping to reduce risk for heart disease and diabetes, promoting mental health, and helping [place] sleep better.  Vitamin D Repletion Sunlight Exposure: Spend  time outdoors: Exposure to sunlight is a natural way for the body to produce vitamin D. Aim for around 10-30 minutes of sun exposure on the face, arms, and legs, at least twice a week. Time of day: Sun exposure is most effective when  the sun is high in the sky (between 10 am and 3 pm). Dietary Sources: Fatty fish: Include salmon, mackerel, and sardines in your diet. Fortified foods: Consume vitamin D-fortified foods such as fortified milk, orange juice, and cereals. Eggs: Eat egg yolks, which naturally contain vitamin D. Supplements: Vitamin D supplements: vitamin D supplements dosage 50000 prescribed per week or 10000IU per day;  based on individual needs and the degree of deficiency. Types of supplements: Vitamin D3 (cholecalciferol) is often recommended as it is more effective at raising and maintaining vitamin D levels in the body. Regular Monitoring: Blood tests: Periodically monitor vitamin D levels through blood tests to assess progress and adjust supplementation if necessary. Healthy Lifestyle: Maintain a healthy diet: Ensure that the overall diet includes a balanced mix of nutrients to support overall health. Regular exercise: Physical activity can contribute to overall well-being, including bone health Again: reviewed food labels and how to track protein per serving on food labels and estimating ounces of meat to track protein intake.  Pre-Op Goals Progress & New Goals Continue: increase physical activity Continue: eliminate alcoholic beverages Continue: avoid skipping meals: aim for 3 meals a day with 2-3 snacks if needed; eat every 3-5 hours. New/Re-engage: practice not drinking with meals; aim for 64 oz of fluids between meals and snacks. Track your fluid Continue: outside physical activity instead of indoor Youtube videos. Continue: take vitamin D3 (over the counter) or ask surgeons office if you need a prescription. Outside activity will help as well. Continue: add in another bottle of water; aim for 64 ounces per day. Continue: on lunch break walk around the parking lot at least twice; walk the dog twice a day when you don't have work. New/re-engage: track your protein intake  Handouts Provided Include     Learning Style & Readiness for Change Teaching method utilized: Visual & Auditory  Demonstrated degree of understanding via: Teach Back  Readiness Level: contemplative Barriers to learning/adherence to lifestyle change: busy schedule; single mother  RD's Notes for next Visit  Patient progress toward chosen goals.  MONITORING & EVALUATION Dietary intake, weekly physical activity, body weight, and pre-op goals   Next Steps  Patient is to return to [facility] in 1-2 weeks for next SWL visit.

## 2022-10-18 ENCOUNTER — Encounter: Payer: No Typology Code available for payment source | Attending: Surgery | Admitting: Dietician

## 2022-10-18 ENCOUNTER — Encounter: Payer: Self-pay | Admitting: Dietician

## 2022-10-18 VITALS — Ht 60.0 in | Wt 226.4 lb

## 2022-10-18 DIAGNOSIS — E669 Obesity, unspecified: Secondary | ICD-10-CM | POA: Insufficient documentation

## 2022-10-18 NOTE — Progress Notes (Signed)
Supervised Weight Loss Visit Bariatric Nutrition Education  Planned surgery: Sleeve Gastrectomy  Pt expectation of surgery: to be 180-190; and health reasons  Referral stated Supervised Weight Loss (SWL) visits needed: 12  9 out of 12 SWL Appointments   NUTRITION ASSESSMENT   Anthropometrics  Start weight at [facility]: 241.1 lbs (date: 05/10/2022)  Height: 60 in Weight today: 226.4 lb BMI: 44.22 kg/m2     Clinical  History of weight loss medication used: not started due to availability Medical hx: sleep apnea, obesity, PTSD, anxiety Medications: escitalopram, hydrOXYzine, ibuprofen, LORazepam, melatonin, REXULTI Labs: cholesterol 207; LDL 145; A1c 5.9; Vit D 8.1 Notable signs/symptoms: none noted Any previous deficiencies? No  Lifestyle & Dietary Hx  Pt was excited about recent weight loss.  Pt states she is using her hand to measure her serving sizes and meat portions. Pt states she is doing much better with her fluids, stating she tries to get almost 5 bottles a day. Pt states she walked twice yesterday.  Estimated daily fluid intake: 64 oz Supplements: vit D Current average weekly physical activity: ADLs at work, on her feet all day; walking the dog daily; Youtube workout 2 days a week, 30-45 minutes.   24-Hr Dietary Recall First Meal: skip or bacon egg and cheese or cheese grits or bagels or cereal Snack: dry cereal or fruit Second Meal: pork lime roast + broccoli and cheese + red potato and onions Snack: maybe pretzels or yogurt or chips or granola bar Third Meal: beef with peppers and onions with red potatoes  Snack: chips and dip some times or something simple Beverages: water, lemonade or cheerwine or sprite or ginger ale  Estimated Energy Needs Calories: 1500  NUTRITION DIAGNOSIS  Overweight/obesity ([place]-3.3) related to past poor dietary habits and physical inactivity as evidenced by patient w/ planned sleeve surgery following dietary guidelines for continued  weight loss.  NUTRITION INTERVENTION  Nutrition counseling (C-1) and education (E-2) to facilitate bariatric surgery goals.  Encouraged patient to honor their body's internal hunger and fullness cues.  Throughout the day, check in mentally and rate hunger. Stop eating when satisfied not full regardless of how much food is left on the plate.  Get more if still hungry 20-30 minutes later.  The key is to honor satisfaction so throughout the meal, rate fullness factor and stop when comfortably satisfied not physically full. The key is to honor hunger and fullness without any feelings of guilt or shame.  Pay attention to what the internal cues are, rather than any external factors. This will enhance the confidence you have in listening to your own body and following those internal cues enabling you to increase how often you eat when you are hungry not out of appetite and stop when you are satisfied not full.  Encouraged pt to continue to drink a minium 64 fluid ounces with half being plain water to satisfy proper hydration. Encouraged pt to avoid drinking with her meals. Discussed the importance of making room for food during meals and snacks. Encouraged pt to eat at scheduled meal and snack times and discussed the importance of not grazing. Encouraged pt to continue to reduce alcoholic beverages. Physical Activity: Aim for 150 minutes of physical activity weekly. Regular physical activity promotes overall health-including helping to reduce risk for heart disease and diabetes, promoting mental health, and helping [place] sleep better.  Vitamin D Repletion Sunlight Exposure: Spend time outdoors: Exposure to sunlight is a natural way for the body to produce vitamin D. Aim  for around 10-30 minutes of sun exposure on the face, arms, and legs, at least twice a week. Time of day: Sun exposure is most effective when the sun is high in the sky (between 10 am and 3 pm). Dietary Sources: Fatty fish: Include salmon,  mackerel, and sardines in your diet. Fortified foods: Consume vitamin D-fortified foods such as fortified milk, orange juice, and cereals. Eggs: Eat egg yolks, which naturally contain vitamin D. Supplements: Vitamin D supplements: vitamin D supplements dosage 50000 prescribed per week or 10000IU per day;  based on individual needs and the degree of deficiency. Types of supplements: Vitamin D3 (cholecalciferol) is often recommended as it is more effective at raising and maintaining vitamin D levels in the body. Regular Monitoring: Blood tests: Periodically monitor vitamin D levels through blood tests to assess progress and adjust supplementation if necessary. Healthy Lifestyle: Maintain a healthy diet: Ensure that the overall diet includes a balanced mix of nutrients to support overall health. Regular exercise: Physical activity can contribute to overall well-being, including bone health Reviewed food labels and how to track protein per serving on food labels and estimating ounces of meat to track protein intake.  Pre-Op Goals Progress & New Goals Continue: increase physical activity Continue: eliminate alcoholic beverages Continue: avoid skipping meals: aim for 3 meals a day with 2-3 snacks if needed; eat every 3-5 hours. Continue: practice not drinking with meals; aim for 64 oz of fluids between meals and snacks. Track your fluid Continue: outside physical activity instead of indoor Youtube videos. Continue: take vitamin D3 (over the counter) or ask surgeons office if you need a prescription. Outside activity will help as well. Continue: add in another bottle of water; aim for 64 ounces per day. Continue: on lunch break walk around the parking lot at least twice; walk the dog twice a day when you don't have work. Continue: track your protein intake New: aim for at least 2 servings of non-starchy vegetables a day.  Handouts Provided Include  Bariatric MyPlate  Learning Style & Readiness  for Change Teaching method utilized: Visual & Auditory  Demonstrated degree of understanding via: Teach Back  Readiness Level: contemplative Barriers to learning/adherence to lifestyle change: busy schedule; single mother  RD's Notes for next Visit  Patient progress toward chosen goals.  MONITORING & EVALUATION Dietary intake, weekly physical activity, body weight, and pre-op goals   Next Steps  Patient is to return to [facility] in 1-2 weeks for next SWL visit.

## 2022-11-02 ENCOUNTER — Encounter: Payer: Self-pay | Admitting: Dietician

## 2022-11-02 ENCOUNTER — Encounter: Payer: No Typology Code available for payment source | Attending: Surgery | Admitting: Dietician

## 2022-11-02 VITALS — Ht 60.0 in | Wt 228.5 lb

## 2022-11-02 DIAGNOSIS — E669 Obesity, unspecified: Secondary | ICD-10-CM | POA: Insufficient documentation

## 2022-11-02 NOTE — Progress Notes (Signed)
Supervised Weight Loss Visit Bariatric Nutrition Education  Planned surgery: Sleeve Gastrectomy  Pt expectation of surgery: to be 180-190; and health reasons  Referral stated Supervised Weight Loss (SWL) visits needed: 12  10 out of 12 SWL Appointments   NUTRITION ASSESSMENT   Anthropometrics  Start weight at [facility]: 241.1 lbs (date: 05/10/2022)  Height: 60 in Weight today: 228.5 lb BMI: 44.63 kg/m2     Clinical  History of weight loss medication used: not started due to availability Medical hx: sleep apnea, obesity, PTSD, anxiety Medications: escitalopram, hydrOXYzine, ibuprofen, LORazepam, melatonin, REXULTI Labs: cholesterol 207; LDL 145; A1c 5.9; Vit D 8.1 Notable signs/symptoms: none noted Any previous deficiencies? No  Lifestyle & Dietary Hx  Pt arrived appearing excited. 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 Pt states she has tried the whole grain noodles, but she doesn't like them very well. Pt states she is trying to find a consistent partner to work out. Pt states she is busy walking behind her children. Pt states she is eating more vegetables for her, and less of the pasta or starches. Pt states she does not like biscuits anymore, stating she will get wheat toast.  Estimated daily fluid intake: 64+ oz Supplements: vit D Current average weekly physical activity: ADLs at work, on her feet all day; walking the dog daily; Youtube workout 2 days a week, 30-45 minutes.   24-Hr Dietary Recall First Meal: skip or bacon egg and cheese or cheese grits or bagels or cereal Snack: dry cereal or fruit Second Meal: pork lime roast + broccoli and cheese + red potato and onions Snack: maybe pretzels or yogurt or chips or granola bar Third Meal: beef with peppers and onions with red potatoes  Snack: chips and dip some times or something simple Beverages: water, lemonade or cheerwine or sprite or ginger ale  Estimated Energy Needs Calories: 1500  NUTRITION DIAGNOSIS   Overweight/obesity ([place]-3.3) related to past poor dietary habits and physical inactivity as evidenced by patient w/ planned sleeve surgery following dietary guidelines for continued weight loss.  NUTRITION INTERVENTION  Nutrition counseling (C-1) and education (E-2) to facilitate bariatric surgery goals.  Encouraged patient to honor their body's internal hunger and fullness cues.  Throughout the day, check in mentally and rate hunger. Stop eating when satisfied not full regardless of how much food is left on the plate.  Get more if still hungry 20-30 minutes later.  The key is to honor satisfaction so throughout the meal, rate fullness factor and stop when comfortably satisfied not physically full. The key is to honor hunger and fullness without any feelings of guilt or shame.  Pay attention to what the internal cues are, rather than any external factors. This will enhance the confidence you have in listening to your own body and following those internal cues enabling you to increase how often you eat when you are hungry not out of appetite and stop when you are satisfied not full.  Encouraged pt to continue to drink a minium 64 fluid ounces with half being plain water to satisfy proper hydration. Encouraged pt to avoid drinking with her meals. Discussed the importance of making room for food during meals and snacks. Encouraged pt to eat at scheduled meal and snack times and discussed the importance of not grazing. Encouraged pt to continue to reduce alcoholic beverages. Physical Activity: Aim for 150 minutes of physical activity weekly. Regular physical activity promotes overall health-including helping to reduce risk for heart disease and diabetes, promoting  mental health, and helping [place] sleep better.  Vitamin D Repletion Sunlight Exposure: Spend time outdoors: Exposure to sunlight is a natural way for the body to produce vitamin D. Aim for around 10-30 minutes of sun exposure on the face, arms, and  legs, at least twice a week. Time of day: Sun exposure is most effective when the sun is high in the sky (between 10 am and 3 pm). Dietary Sources: Fatty fish: Include salmon, mackerel, and sardines in your diet. Fortified foods: Consume vitamin D-fortified foods such as fortified milk, orange juice, and cereals. Eggs: Eat egg yolks, which naturally contain vitamin D. Supplements: Vitamin D supplements: vitamin D supplements dosage 50000 prescribed per week or 10000IU per day;  based on individual needs and the degree of deficiency. Types of supplements: Vitamin D3 (cholecalciferol) is often recommended as it is more effective at raising and maintaining vitamin D levels in the body. Regular Monitoring: Blood tests: Periodically monitor vitamin D levels through blood tests to assess progress and adjust supplementation if necessary. Healthy Lifestyle: Maintain a healthy diet: Ensure that the overall diet includes a balanced mix of nutrients to support overall health. Regular exercise: Physical activity can contribute to overall well-being, including bone health Reviewed food labels and how to track protein per serving on food labels and estimating ounces of meat to track protein intake.  Pre-Op Goals Progress & New Goals Continue: increase physical activity Continue: eliminate alcoholic beverages Continue: avoid skipping meals: aim for 3 meals a day with 2-3 snacks if needed; eat every 3-5 hours. Continue: practice not drinking with meals; aim for 64 oz of fluids between meals and snacks. Track your fluid Continue: outside physical activity instead of indoor Youtube videos. Continue: take vitamin D3 (over the counter) or ask surgeons office if you need a prescription. Outside activity will help as well. Continue: add in another bottle of water; aim for 64 ounces per day. Continue: on lunch break walk around the parking lot at least twice; walk the dog twice a day when you don't have  work. Continue: track your protein intake; aim for 60 grams per day. Continue: aim for at least 2 servings of non-starchy vegetables a day. New: increase whole grains; look 100% whole wheat breads  Handouts Provided Include  Bariatric MyPlate  Learning Style & Readiness for Change Teaching method utilized: Visual & Auditory  Demonstrated degree of understanding via: Teach Back  Readiness Level: contemplative Barriers to learning/adherence to lifestyle change: busy schedule; single mother  RD's Notes for next Visit  Patient progress toward chosen goals.  MONITORING & EVALUATION Dietary intake, weekly physical activity, body weight, and pre-op goals   Next Steps  Patient is to return to [facility] in 1-2 weeks for next SWL visit.

## 2022-11-16 ENCOUNTER — Encounter: Payer: No Typology Code available for payment source | Attending: Surgery | Admitting: Dietician

## 2022-11-16 ENCOUNTER — Encounter: Payer: Self-pay | Admitting: Dietician

## 2022-11-16 VITALS — Ht 60.0 in | Wt 225.6 lb

## 2022-11-16 DIAGNOSIS — E669 Obesity, unspecified: Secondary | ICD-10-CM | POA: Diagnosis present

## 2022-11-16 NOTE — Progress Notes (Signed)
Supervised Weight Loss Visit Bariatric Nutrition Education  Planned surgery: Sleeve Gastrectomy  Pt expectation of surgery: to be 180-190; and health reasons  Referral stated Supervised Weight Loss (SWL) visits needed: 12  11 out of 12 SWL Appointments   NUTRITION ASSESSMENT   Anthropometrics  Start weight at [facility]: 241.1 lbs (date: 05/10/2022)  Height: 60 in Weight today: 225.6 lb BMI: 44.06 kg/m2     Clinical  History of weight loss medication used: not started due to availability Medical hx: sleep apnea, obesity, PTSD, anxiety Medications: escitalopram, hydrOXYzine, ibuprofen, LORazepam, melatonin, REXULTI Labs: cholesterol 207; LDL 145; A1c 5.9; Vit D 8.1 Notable signs/symptoms: none noted Any previous deficiencies? 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 Pt states she found a partner for working out at [employer], stating they are going today and see how it goes. Pt states she will use the treadmill and arm and leg machines, stating she plans to spend 30-60 minutes at the gym.  Pt states she is doing better practicing not drinking with meals, stating she sometimes drinks with meat. Pt states she does not drink alcohol on the weekend. Pt agreeable to avoid alcohol until next visit.  Estimated daily fluid intake: 64+ oz Supplements: vit D Current average weekly physical activity: ADLs at work, on her feet all day; walking the dog daily; Youtube workout 2 days a week, 30-45 minutes.   24-Hr Dietary Recall First Meal: skip or bacon egg and cheese or cheese grits or bagels or cereal Snack: dry cereal or fruit Second Meal: pork lime roast + broccoli and cheese + red potato and onions Snack: maybe pretzels or yogurt or chips or granola bar Third Meal: beef with peppers and onions with red potatoes  Snack: chips and dip some times or something simple Beverages: water, lemonade or cheerwine or  sprite or ginger ale  Estimated Energy Needs Calories: 1500  NUTRITION DIAGNOSIS  Overweight/obesity ([place]-3.3) related to past poor dietary habits and physical inactivity as evidenced by patient w/ planned sleeve surgery following dietary guidelines for continued weight loss.  NUTRITION INTERVENTION  Nutrition counseling (C-1) and education (E-2) to facilitate bariatric surgery goals.  Encouraged patient to honor their body's internal hunger and fullness cues.  Throughout the day, check in mentally and rate hunger. Stop eating when satisfied not full regardless of how much food is left on the plate.  Get more if still hungry 20-30 minutes later.  The key is to honor satisfaction so throughout the meal, rate fullness factor and stop when comfortably satisfied not physically full. The key is to honor hunger and fullness without any feelings of guilt or shame.  Pay attention to what the internal cues are, rather than any external factors. This will enhance the confidence you have in listening to your own body and following those internal cues enabling you to increase how often you eat when you are hungry not out of appetite and stop when you are satisfied not full.  Encouraged pt to continue to drink a minium 64 fluid ounces with half being plain water to satisfy proper hydration. Encouraged pt to avoid drinking with her meals. Discussed the importance of making room for food during meals and snacks. Encouraged pt to eat at scheduled meal and snack times and discussed the importance of not grazing. Encouraged pt to continue to reduce alcoholic beverages. Physical Activity: Aim for 150 minutes of  physical activity weekly. Regular physical activity promotes overall health-including helping to reduce risk for heart disease and diabetes, promoting mental health, and helping [place] sleep better.  Vitamin D Repletion Sunlight Exposure: Spend time outdoors: Exposure to sunlight is a natural way for the body to  produce vitamin D. Aim for around 10-30 minutes of sun exposure on the face, arms, and legs, at least twice a week. Time of day: Sun exposure is most effective when the sun is high in the sky (between 10 am and 3 pm). Regular exercise: Physical activity can contribute to overall well-being, including bone health Reviewed food labels and how to track protein per serving on food labels and estimating ounces of meat to track protein intake. Why you need complex carbohydrates: Whole grains and other complex carbohydrates are required to have a healthy diet. Whole grains provide fiber which can help with blood glucose levels and help keep you satiated. Fruits and starchy vegetables provide essential vitamins and minerals required for immune function, eyesight support, brain support, bone density, wound healing and many other functions within the body. According to the current evidenced based 2020-2025 Dietary Guidelines for Americans, complex carbohydrates are part of a healthy eating pattern which is associated with a decreased risk for type 2 diabetes, cancers, and cardiovascular disease.    Pre-Op Goals Progress & New Goals Continue: increase physical activity; gym with friend (workout partner) Re-engage: eliminate alcoholic beverages Re-engage: avoid skipping meals: aim for 3 meals a day with 2-3 snacks if needed; eat every 3-5 hours Re-engage: practice not drinking with meals; aim for 64 oz of fluids between meals and snacks. Track your fluid Continue: outside physical activity instead of indoor Youtube videos. Continue: take vitamin D3 (over the counter) or ask surgeons office if you need a prescription. Outside activity will help as well. Continue: add in another bottle of water; aim for 64 ounces per day. Continue: on lunch break walk around the parking lot at least twice; walk the dog twice a day when you don't have work. Continue: track your protein intake; aim for 60 grams per day. Continue:  aim for at least 2 servings of non-starchy vegetables a day. Continue: increase whole grains; look 100% whole wheat breads  Handouts Provided Include    Learning Style & Readiness for Change Teaching method utilized: Visual & Auditory  Demonstrated degree of understanding via: Teach Back  Readiness Level: contemplative Barriers to learning/adherence to lifestyle change: busy schedule; single mother  RD's Notes for next Visit  Patient progress toward chosen goals.  MONITORING & EVALUATION Dietary intake, weekly physical activity, body weight, and pre-op goals   Next Steps  Patient is to return to [facility] in 2 weeks for next SWL visit.

## 2022-11-29 ENCOUNTER — Encounter: Payer: No Typology Code available for payment source | Attending: Surgery | Admitting: Dietician

## 2022-11-29 ENCOUNTER — Encounter: Payer: Self-pay | Admitting: Dietician

## 2022-11-29 VITALS — Ht 60.0 in | Wt 229.5 lb

## 2022-11-29 DIAGNOSIS — E669 Obesity, unspecified: Secondary | ICD-10-CM | POA: Insufficient documentation
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
## 2023-02-11 ENCOUNTER — Ambulatory Visit (INDEPENDENT_AMBULATORY_CARE_PROVIDER_SITE_OTHER): Payer: 59 | Admitting: Licensed Clinical Social Worker

## 2023-02-11 DIAGNOSIS — Z8659 Personal history of other mental and behavioral disorders: Secondary | ICD-10-CM

## 2023-02-11 DIAGNOSIS — F331 Major depressive disorder, recurrent, moderate: Secondary | ICD-10-CM
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
## 2023-03-07 ENCOUNTER — Encounter: Payer: Self-pay | Attending: Surgery | Admitting: Skilled Nursing Facility1

## 2023-03-07 VITALS — Wt 211.6 lb

## 2023-03-07 DIAGNOSIS — E669 Obesity, unspecified: Secondary | ICD-10-CM | POA: Insufficient documentation

## 2023-03-08 ENCOUNTER — Encounter: Payer: Self-pay | Admitting: Skilled Nursing Facility1

## 2023-03-08 NOTE — Progress Notes (Signed)
Pre-Operative Nutrition Class:    Patient was seen on 03/07/2023 for Pre-Operative Bariatric Surgery Education at [facility].      Anthropometrics  Start weight at [facility]: 241.1 lbs (date: 05/10/2022)  Height: 60 in Weight today: 211.6 pounds  Clinical  History of weight loss medication used: not started due to availability Medical hx: sleep apnea, obesity, PTSD, anxiety Medications: escitalopram, hydrOXYzine, ibuprofen, LORazepam, melatonin, REXULTI Labs: cholesterol 207; LDL 145; A1c 5.9; Vit D 8.1 Notable signs/symptoms: none noted Any previous deficiencies? No   Pt states they are in the middle of trying to diagnose why she sometimes has trouble swallowing (states she has figured out how to prep her food to avoid vomiting/chocking) with the tremble in her voice.    The following the learning objectives were met by the patient during this course: Identify Pre-Op Dietary Goals and will begin 2 weeks pre-operatively Identify appropriate sources of fluids and proteins  State protein recommendations and appropriate sources pre and post-operatively Identify Post-Operative Dietary Goals and will follow for 2 weeks post-operatively Identify appropriate multivitamin and calcium sources Describe the need for physical activity post-operatively and will follow MD recommendations State when to call healthcare provider regarding medication questions or post-operative complications When having a diagnosis of diabetes understanding hypoglycemia symptoms and the inclusion of 1 complex carbohydrate per meal  Handouts given during class include: Pre-Op Bariatric Surgery Diet Handout Protein Shake Handout Post-Op Bariatric Surgery Nutrition Handout BELT Program Information Flyer Support Group Information Flyer [facility] Bariatric Supplements Price List  Follow-Up Plan: Patient will follow-up at [facility] 2 weeks post operatively for diet advancement  per MD.

## 2023-04-12 ENCOUNTER — Emergency Department (HOSPITAL_COMMUNITY): Payer: Self-pay

## 2023-04-12 ENCOUNTER — Emergency Department (HOSPITAL_BASED_OUTPATIENT_CLINIC_OR_DEPARTMENT_OTHER)
Admission: EM | Admit: 2023-04-12 | Discharge: 2023-04-13 | Disposition: A | Discharge status: Home / Self Care | Payer: Self-pay | Attending: Emergency Medicine | Admitting: Emergency Medicine

## 2023-04-12 ENCOUNTER — Encounter (HOSPITAL_BASED_OUTPATIENT_CLINIC_OR_DEPARTMENT_OTHER): Payer: Self-pay | Admitting: Emergency Medicine

## 2023-04-12 ENCOUNTER — Emergency Department (HOSPITAL_BASED_OUTPATIENT_CLINIC_OR_DEPARTMENT_OTHER): Payer: Self-pay

## 2023-04-12 ENCOUNTER — Other Ambulatory Visit: Payer: Self-pay

## 2023-04-12 DIAGNOSIS — H538 Other visual disturbances: Secondary | ICD-10-CM | POA: Diagnosis not present

## 2023-04-12 DIAGNOSIS — R1310 Dysphagia, unspecified: Secondary | ICD-10-CM | POA: Insufficient documentation

## 2023-04-12 DIAGNOSIS — R519 Headache, unspecified: Secondary | ICD-10-CM | POA: Diagnosis not present

## 2023-04-12 DIAGNOSIS — R531 Weakness: Secondary | ICD-10-CM | POA: Diagnosis present

## 2023-04-12 DIAGNOSIS — R490 Dysphonia: Secondary | ICD-10-CM | POA: Insufficient documentation

## 2023-04-12 DIAGNOSIS — R479 Unspecified speech disturbances: Secondary | ICD-10-CM | POA: Diagnosis not present

## 2023-04-12 LAB — CBC WITH DIFFERENTIAL/PLATELET
Abs Immature Granulocytes: 0.02 10*3/uL (ref 0.00–0.07)
Basophils Absolute: 0 10*3/uL (ref 0.0–0.1)
Basophils Relative: 0 %
Eosinophils Absolute: 0.1 10*3/uL (ref 0.0–0.5)
Eosinophils Relative: 1 %
HCT: 41.5 % (ref 36.0–46.0)
Hemoglobin: 13.7 g/dL (ref 12.0–15.0)
Immature Granulocytes: 0 %
Lymphocytes Relative: 36 %
Lymphs Abs: 3 10*3/uL (ref 0.7–4.0)
MCH: 29.8 pg (ref 26.0–34.0)
MCHC: 33 g/dL (ref 30.0–36.0)
MCV: 90.4 fL (ref 80.0–100.0)
Monocytes Absolute: 0.5 10*3/uL (ref 0.1–1.0)
Monocytes Relative: 6 %
Neutro Abs: 4.7 10*3/uL (ref 1.7–7.7)
Neutrophils Relative %: 57 %
Platelets: 300 10*3/uL (ref 150–400)
RBC: 4.59 MIL/uL (ref 3.87–5.11)
RDW: 13.5 % (ref 11.5–15.5)
WBC: 8.3 10*3/uL (ref 4.0–10.5)
nRBC: 0 % (ref 0.0–0.2)

## 2023-04-12 LAB — BASIC METABOLIC PANEL
Anion gap: 10 (ref 5–15)
BUN: 13 mg/dL (ref 6–20)
CO2: 29 mmol/L (ref 22–32)
Calcium: 10.4 mg/dL — ABNORMAL HIGH (ref 8.9–10.3)
Chloride: 102 mmol/L (ref 98–111)
Creatinine, Ser: 0.6 mg/dL (ref 0.44–1.00)
GFR, Estimated: >60 mL/min (ref >60)
Glucose, Bld: 75 mg/dL (ref 70–99)
Potassium: 3.8 mmol/L (ref 3.5–5.1)
Sodium: 141 mmol/L (ref 135–145)

## 2023-04-12 LAB — MAGNESIUM: Magnesium: 1.9 mg/dL (ref 1.7–2.4)

## 2023-04-12 LAB — HCG, SERUM, QUALITATIVE: Preg, Serum: NEGATIVE

## 2023-04-12 MED ORDER — ACETAMINOPHEN 500 MG PO TABS
1000.0000 mg | ORAL_TABLET | Freq: Once | ORAL | Status: AC
  Administered 2023-04-12: 1000 mg via ORAL
  Filled 2023-04-12: qty 2

## 2023-04-12 MED ORDER — SCOPOLAMINE 1 MG/3DAYS TD PT72
1.0000 | MEDICATED_PATCH | Freq: Once | TRANSDERMAL | Status: DC
  Administered 2023-04-12: 1.5 mg via TRANSDERMAL
  Filled 2023-04-12: qty 1

## 2023-04-12 MED ORDER — GADOBUTROL 1 MMOL/ML IV SOLN
10.0000 mL | Freq: Once | INTRAVENOUS | Status: AC | PRN
  Administered 2023-04-12: 10 mL via INTRAVENOUS
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
## 2023-04-12 NOTE — ED Notes (Signed)
 Pt return from MRI, NAD Noted. A&O x4

## 2023-04-12 NOTE — ED Triage Notes (Signed)
 States was in [facility] a year ago and states symptoms have not improved. Been to multiple hospital systems for the same. Reports pain in both shoulder, "problems with brain"  Patient showed a screenshot of "symptoms of brainstem injury" with listed symptoms. Patient reports same symptoms.

## 2023-04-12 NOTE — ED Notes (Signed)
Patient transported to MRI with transporter 

## 2023-04-12 NOTE — ED Notes (Signed)
 Called report to Maralyn Sago, Consulting civil engineer at [facility] for MRI. Dr. Silverio Lay accepting.

## 2023-04-13 NOTE — Discharge Instructions (Signed)
 No clear cause for your symptoms have been identified.  We recommend that you continue to follow-up with your doctor and with the neurologist.  The neurology office will contact you for an appointment.

## 2023-04-13 NOTE — ED Provider Notes (Signed)
 Patient signed out to me at shift change pending MRIs.  12:48 AM MRIs are normal.  No clear cause of the patient's symptoms have been identified.  Will have patient follow-up with PCP and neurology.   Roxy Horseman, PA-C 04/13/23 0049    Dione Booze, MD 04/13/23 [phone]
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
## 2023-04-21 ENCOUNTER — Other Ambulatory Visit

## 2023-04-21 ENCOUNTER — Ambulatory Visit: Payer: Self-pay | Admitting: Neurology

## 2023-04-21 ENCOUNTER — Encounter: Payer: Self-pay | Admitting: Neurology

## 2023-04-21 VITALS — BP 94/59 | HR 53 | Ht 60.0 in | Wt 204.0 lb

## 2023-04-21 DIAGNOSIS — H532 Diplopia: Secondary | ICD-10-CM | POA: Diagnosis not present

## 2023-04-21 DIAGNOSIS — M6281 Muscle weakness (generalized): Secondary | ICD-10-CM | POA: Diagnosis not present

## 2023-04-21 DIAGNOSIS — R0601 Orthopnea: Secondary | ICD-10-CM

## 2023-04-21 DIAGNOSIS — R471 Dysarthria and anarthria: Secondary | ICD-10-CM

## 2023-04-21 DIAGNOSIS — R1310 Dysphagia, unspecified: Secondary | ICD-10-CM | POA: Diagnosis not present

## 2023-04-21 DIAGNOSIS — H02403 Unspecified ptosis of bilateral eyelids: Secondary | ICD-10-CM

## 2023-04-21 NOTE — Patient Instructions (Signed)
 I saw you today for problems talking, swallowing, double vision, and weakness. I am concerned you may have a muscle weakness disease, such as myasthenia gravis.  I am going to send blood work today looking for this.   Depending on the results, we may be able to start treatment or need more testing.  I would hold off on your gastric sleeve surgery until we get this figured out.  Go to nearest emergency room if you have severe weakness, difficulty breathing or swallowing as this could be a myasthenic crisis (flare).  I will be in touch when I have your results.  The physicians and staff at [facility] are committed to providing excellent care. You may receive a survey requesting feedback about your experience at our office. We strive to receive "very good" responses to the survey questions. If you feel that your experience would prevent you from giving the office a "very good " response, please contact our office to try to remedy the situation. We may be reached at [phone]. Thank you for taking the time out of your busy day to complete the survey.  Jacquelyne Balint, MD [facility]

## 2023-04-28 LAB — MYASTHENIA GRAVIS PANEL 2
A CHR BINDING ABS: <0.3 nmol/L
ACHR Blocking Abs: <15 %{inhibition} (ref <15)
Acetylchol Modul Ab: 11 %{inhibition}

## 2023-04-28 LAB — TSH: TSH: 0.76 m[IU]/L

## 2023-04-29 ENCOUNTER — Other Ambulatory Visit: Payer: Self-pay

## 2023-04-29 ENCOUNTER — Telehealth: Payer: Self-pay | Admitting: Neurology

## 2023-04-29 DIAGNOSIS — R531 Weakness: Secondary | ICD-10-CM

## 2023-04-29 NOTE — Telephone Encounter (Signed)
Made order

## 2023-04-29 NOTE — Telephone Encounter (Signed)
 Pt. Is at work but please leave a detailed message on results and she will call bk

## 2023-04-29 NOTE — Telephone Encounter (Signed)
 Returned patient's call. Her TSH and AChR abs were normal. We discussed next steps. I recommended we proceed with EMG with RNS. Patient agreed. I offered a trial of mestinon, but patient preferred to wait on testing.  I will get her scheduled ASAP.  All questions were answered.  Jacquelyne Balint, MD [facility]

## 2023-04-29 NOTE — Telephone Encounter (Signed)
 Pt calling for results

## 2023-05-05 ENCOUNTER — Telehealth: Payer: Self-pay | Admitting: Neurology

## 2023-05-05 NOTE — Telephone Encounter (Signed)
 Called patient to see if we can get her in before her 05/23/23 appt. Dr.Hill wants to see her ASAP. I did try to get her in sooner but she declined. Dr.Hill wanted me to call again to see if we could get her in 05/09/23 at 11:00am. Waiting on a call back from patient.

## 2023-05-06 ENCOUNTER — Encounter (HOSPITAL_COMMUNITY): Admission: RE | Admit: 2023-05-06 | Payer: Self-pay | Source: Ambulatory Visit

## 2023-05-06 NOTE — Telephone Encounter (Signed)
 Pt has appointment on 05/09/23

## 2023-05-09 ENCOUNTER — Other Ambulatory Visit

## 2023-05-09 ENCOUNTER — Telehealth: Payer: Self-pay | Admitting: Neurology

## 2023-05-09 ENCOUNTER — Ambulatory Visit: Admitting: Neurology

## 2023-05-09 DIAGNOSIS — M6281 Muscle weakness (generalized): Secondary | ICD-10-CM

## 2023-05-09 DIAGNOSIS — G7000 Myasthenia gravis without (acute) exacerbation: Secondary | ICD-10-CM | POA: Diagnosis not present

## 2023-05-09 DIAGNOSIS — R531 Weakness: Secondary | ICD-10-CM

## 2023-05-09 DIAGNOSIS — H02403 Unspecified ptosis of bilateral eyelids: Secondary | ICD-10-CM

## 2023-05-09 DIAGNOSIS — R471 Dysarthria and anarthria: Secondary | ICD-10-CM

## 2023-05-09 DIAGNOSIS — R1310 Dysphagia, unspecified: Secondary | ICD-10-CM

## 2023-05-09 DIAGNOSIS — H532 Diplopia: Secondary | ICD-10-CM

## 2023-05-09 MED ORDER — PREDNISONE 10 MG PO TABS: 20.0000 mg | ORAL_TABLET | Freq: Every day | ORAL | 3 refills | Status: DC

## 2023-05-09 MED ORDER — PYRIDOSTIGMINE BROMIDE 60 MG PO TABS: 60.0000 mg | ORAL_TABLET | Freq: Three times a day (TID) | ORAL | 3 refills | Status: DC

## 2023-05-09 NOTE — Procedures (Signed)
 [facility]  [address] [place], [address]  [place], [place] [address] Tel: [phone] Fax: [phone] Test Date:  05/09/2023  Patient: Jenna Blair DOB: November 05, 1985 Physician: Jacquelyne Balint, MD  Sex: Female Height: 5\' 0"  Ref Phys: Jacquelyne Balint, MD  ID#: [id number]   Technician:    History: This is a 38 year old female with dysarthria, dysphagia, ptosis, and proximal weakness.  NCV & EMG Findings: Extensive electrodiagnostic evaluation of the right upper and lower limbs with additional nerve conduction studies of the right facial nerve and 3 Hz repetitive nerve stimulation of the right median and facial motor nerves shows: Right sural, superficial peroneal/fibular, median, and ulnar sensory responses are within normal limits. Right peroneal/fibular (EDB), tibial (AH), median (APB), and facial (nasalis) motor responses are within normal limits. 3 Hz repetitive nerve stimulation of the right median (APB) and facial (nasalis) nerves shows abnormal decrement (> 10%). Right H reflex latency is absent. There is no evidence of active or chronic motor axon loss changes affecting any of the tested muscles on needle examination. Motor unit configuration and recruitment pattern is within normal limits.  Impression: This is an abnormal study. The findings are most consistent with the following: Decremental response at rest and after exercise with 3 Hz repetitive right median and facial nerve stimulation consistent with a generalized defect of neuromuscular junction transmission, most consistent with myasthenia gravis. No electrodiagnostic evidence of a large fiber sensorimotor neuropathy, myopathy, or cervical (C5-C8) or lumbosacral (L2-S1) motor radiculopathy. Absent H reflex latency in isolated is of unknown significance and may be technical in nature.   ___________________________ Jacquelyne Balint, MD    NCS+ Motor Nerve Results    Latency Amplitude F-Lat Segment Distance CV Comment   Site (ms) Norm (mV) Norm (ms)  (cm) (m/s) Norm   Right Facial - Nasalis Motor  Mastoid 3.3  < 4.2 1.61  > 1.00        Right Fibular (EDB) Motor  Ankle 3.1  < 5.5 4.5  > 3.0        Bel fib head 9.6 - 4.5 -  Bel fib head-Ankle 31 48  > 40   Pop fossa 11.0 - 4.4 -  Pop fossa-Bel fib head 7 50 -   Right Median (APB) Motor  Wrist 3.7  < 3.9 8.3  > 6.0        Elbow 7.9 - 7.5 -  Elbow-Wrist 23.5 56  > 50   Right Tibial (AH) Motor  Ankle 3.4  < 6.0 8.0  > 8.0        Knee 10.7 - 7.7 -  Knee-Ankle 39 53  > 40    Sensory Sites    Neg Peak Lat Amplitude (O-P) Segment Distance Velocity Comment  Site (ms) Norm (V) Norm  (cm) (ms)   Right Median Sensory  Wrist-Dig II 3.3  < 3.4 69  > 20 Wrist-Dig II 13    Right Superficial Fibular Sensory  14 cm-Ankle 4.5  < 4.5 5  > 5 14 cm-Ankle 14    Right Sural Sensory  Calf-Lat mall 3.7  < 4.5 11  > 5 Calf-Lat mall 14    Right Ulnar Sensory  Wrist-Dig V 2.7  < 3.1 59  > 12 Wrist-Dig V 11     H-Reflex Results    M-Lat H Lat H Neg Amp H-M Lat  Site (ms) (ms) Norm (mV) (ms)  Right Tibial H-Reflex  Pop fossa 4.8 -  < 35.0 - -  RNS   Trial # Label Amp 1 (mV)  O-P Amp 5 (mV)  O-P Amp % Dif Area 1 (mVms) Area 5 (mVms) Area % Dif Rep Rate Train Length Pause Time (min:sec) Comments  Right Abductor pollicis brevis  Tr 1 Baseline 9.86 8.32 -15.6 26.13 19.43 -25.6 3.00 10 00:30   Tr 2 Post Exercise 10.65 9.54 -10.5 25.74 21.06 -18.2 3.00 10 01:00   Tr 3 1 min Post 11.09 9.41 -15.2 28.10 21.25 -24.4 3.00 10 01:00   Tr 4 2 min Post 11.00 9.13 -17.0 27.97 20.72 -25.9 3.00 10 01:00   Tr 5 3 min Post 10.93 8.96 -18.0 27.82 20.42 -26.6 3.00 10 00:00   Right Nasalis  Tr 1 Baseline 1.74 1.47 -15.5 8.61 6.53 -24.2 3.00 10 00:30   Tr 2 Post Exercise 1.75 1.48 -15.3 8.43 6.94 -17.7 3.00 10 01:00   Tr 3 1 min Post 1.62 1.37 -15.4 8.09 6.79 -16.1 3.00 10 01:00   Tr 4 2 min Post 1.61 1.29 -19.8 7.90 6.02 -23.8 3.00 10 01:00   Tr 5 3 min Post 1.57 1.34 -14.8 7.63  6.67 -12.7 3.00 10 00:00          EMG+   Side Muscle Ins.Act Fibs Fasc Recrt Amp Dur Poly Activation Comment  Right Tib ant Nml Nml Nml Nml Nml Nml Nml Nml N/A  Right Gastroc MH Nml Nml Nml Nml Nml Nml Nml Nml N/A  Right Vastus lat Nml Nml Nml Nml Nml Nml Nml Nml N/A  Right Iliacus Nml Nml Nml Nml Nml Nml Nml Nml N/A  Right Gluteus med Nml Nml Nml Nml Nml Nml Nml Nml N/A  Right FDI Nml Nml Nml Nml Nml Nml Nml Nml N/A  Right Pronator teres Nml Nml Nml Nml Nml Nml Nml Nml N/A  Right Biceps Nml Nml Nml Nml Nml Nml Nml Nml N/A  Right Triceps lat hd Nml Nml Nml Nml Nml Nml Nml Nml N/A  Right Deltoid Nml Nml Nml Nml Nml Nml Nml Nml N/A      Waveforms:  Motor           Sensory           H-Reflex

## 2023-05-09 NOTE — Telephone Encounter (Signed)
 I spoke with patient after EMG today. EMG showed decremental response to RNS most consistent with myasthenia gravis, as suspected. I discussed MG with patient and provided her information about the disease. 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  We agreed to this plan: -Check MuSK antibodies -CT chest to evaluate for thymoma -Start prednisone 20 mg daily -Start mestinon 60 mg TID -Discussed MG crisis and when to seek emergency evaluation  Patient to follow up in 1 month and call with new or worsening symptoms in the meantime. All questions were answered.  Rommie Coats, MD [facility]

## 2023-05-10 ENCOUNTER — Ambulatory Visit (HOSPITAL_COMMUNITY): Admit: 2023-05-10 | Payer: Self-pay | Admitting: Surgery

## 2023-05-10 SURGERY — GASTRECTOMY, SLEEVE, LAPAROSCOPIC
Anesthesia: General

## 2023-05-17 ENCOUNTER — Encounter: Payer: Self-pay | Admitting: Neurology

## 2023-05-18 LAB — MUSK ANTIBODIES

## 2023-05-19 ENCOUNTER — Telehealth: Payer: Self-pay | Admitting: Neurology

## 2023-05-19 DIAGNOSIS — G7000 Myasthenia gravis without (acute) exacerbation: Secondary | ICD-10-CM

## 2023-05-19 MED ORDER — PREDNISONE 20 MG PO TABS: 40.0000 mg | ORAL_TABLET | Freq: Every day | ORAL | 5 refills | Status: DC

## 2023-05-19 NOTE — Telephone Encounter (Signed)
 Called patient to discuss lab results. 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   She is currently on Prednisone  20 mg and mestinon  60 mg TID. There has been no significant change in symptoms.  Given that I now know she is MuSK ab positive, I will change the plan as follows: -Stop mestinon . -Increase prednisone : 30 mg daily for 1 week, then increase 40 mg daily thereafter -Will consider rituximab if prednisone  is not working  All questions were answered. We discussed MG crisis.  Jenna Coats, MD [facility]

## 2023-05-23 ENCOUNTER — Telehealth: Payer: Self-pay

## 2023-05-23 ENCOUNTER — Telehealth: Payer: Self-pay | Admitting: Neurology

## 2023-05-23 ENCOUNTER — Encounter: Admitting: Neurology

## 2023-05-23 NOTE — Telephone Encounter (Signed)
 I attempted to call patient back. I left her a message. I am concerned she is having MG crisis. She likely needs urgent plasma exchange (usually better for MuSK MG than IVIg). I asked for a call back or patient to go to ED to be evaluated.  Rommie Coats, MD [facility]

## 2023-05-23 NOTE — Telephone Encounter (Signed)
 Patient's double vision worsened over the weakness. She had some dizziness as well. She also has had weakness in her arms and legs. She mentions having to crawl up and down the steps this weekend. She had difficulty swallowing and eating this weekend.  She has improved today. I had recommended previously that she go to ED urgently, but given she is better today, I think it is reasonable for her to monitor closely. It could be that the recent increase in prednisone  temporarily made her MG worse. I explained if she had this extreme weakness again, including not being able to swallow or breath, she had to urgently go to ED. She understood and agreed.  All questions were answered.  Rommie Coats, MD [facility]

## 2023-05-23 NOTE — Telephone Encounter (Signed)
 Pt. Called back for missed call but disconnected while I was trying to get intouch with Nurse

## 2023-05-23 NOTE — Telephone Encounter (Signed)
 Pt. Wanted to leave message for Dr. Genita Keys that she is "going backwards and her double vision is through the roof and her body is not moving. Please call her back"

## 2023-05-31 ENCOUNTER — Telehealth: Payer: Self-pay | Admitting: Neurology

## 2023-05-31 NOTE — Telephone Encounter (Signed)
 I called to check on patient's symptoms. She sound much less dysarthric on the phone. She mentions her voice is more clear now and that she feels things are starting to get better in terms of weakness. She still have the diplopia.  Given that she is showing signs of improvement, I will continue the current treatment with prednisone  40 mg daily. She will have her CT chest this week and follow up with me later this month. She will call with any new or worsening symptoms.  All questions were answered.  Rommie Coats, MD [facility]

## 2023-06-02 ENCOUNTER — Other Ambulatory Visit: Payer: Self-pay

## 2023-06-02 ENCOUNTER — Telehealth: Payer: Self-pay | Admitting: Neurology

## 2023-06-02 ENCOUNTER — Other Ambulatory Visit

## 2023-06-02 DIAGNOSIS — G7000 Myasthenia gravis without (acute) exacerbation: Secondary | ICD-10-CM

## 2023-06-02 NOTE — Telephone Encounter (Signed)
 Called pt and left message that Dr. Genita Keys does not recommend the surgery at this time . He talked with her about this at the office visit . Needs to get MG under control. To call if questions or concerns.

## 2023-06-02 NOTE — Telephone Encounter (Signed)
 Pt. Called while at [facility] and they cncld appt due to Insurance not authorizing, they said they would need another referral order sent to retry and resched
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
## 2023-06-07 NOTE — Telephone Encounter (Signed)
 Jenna Blair

## 2023-06-08 ENCOUNTER — Telehealth: Payer: Self-pay | Admitting: Neurology

## 2023-06-08 NOTE — Telephone Encounter (Signed)
 I returned patient's call. She is still getting better in terms of voice, swallowing, and strength, but her diplopia is not improving as much and ?getting worse. We agreed to give prednisone  40 mg daily a little longer given that the other symptoms are improving. She may need escalation of care though, which I will determine at her next follow up later this month.  All questions were answered.  Rommie Coats, MD [facility]

## 2023-06-08 NOTE — Telephone Encounter (Signed)
 Attempted to return patient's call so that I could understand what symptoms were better and what she was still having issues with. I asked for her to call back to the office when able.  Rommie Coats, MD [facility]

## 2023-06-08 NOTE — Telephone Encounter (Signed)
 Pt called in stating her medication is working well, but her double vision doesn't seem to be getting better. She thinks the double vision is getting worse.

## 2023-06-08 NOTE — Telephone Encounter (Signed)
 Pt is returning a call to DR [facility] she states

## 2023-06-14 ENCOUNTER — Telehealth: Payer: Self-pay

## 2023-06-14 NOTE — Telephone Encounter (Signed)
 Called pt to let her know that CT scan was approved and to call [facility] for an appointment.

## 2023-06-14 NOTE — Telephone Encounter (Signed)
 Pt approved for CT chest with contrast # [id number]   April 18 th 2025 through November 09, 2023
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
## 2023-06-23 ENCOUNTER — Ambulatory Visit: Admitting: Neurology

## 2023-06-23 ENCOUNTER — Encounter: Payer: Self-pay | Admitting: Neurology

## 2023-06-23 VITALS — BP 130/80 | HR 80 | Ht 60.0 in | Wt 218.0 lb

## 2023-06-23 DIAGNOSIS — R471 Dysarthria and anarthria: Secondary | ICD-10-CM | POA: Diagnosis not present

## 2023-06-23 DIAGNOSIS — M6281 Muscle weakness (generalized): Secondary | ICD-10-CM

## 2023-06-23 DIAGNOSIS — T380X5A Adverse effect of glucocorticoids and synthetic analogues, initial encounter: Secondary | ICD-10-CM

## 2023-06-23 DIAGNOSIS — D84821 Immunodeficiency due to drugs: Secondary | ICD-10-CM

## 2023-06-23 DIAGNOSIS — H532 Diplopia: Secondary | ICD-10-CM

## 2023-06-23 DIAGNOSIS — G7000 Myasthenia gravis without (acute) exacerbation: Secondary | ICD-10-CM

## 2023-06-23 DIAGNOSIS — R1310 Dysphagia, unspecified: Secondary | ICD-10-CM

## 2023-06-23 DIAGNOSIS — R0601 Orthopnea: Secondary | ICD-10-CM

## 2023-06-23 DIAGNOSIS — H02403 Unspecified ptosis of bilateral eyelids: Secondary | ICD-10-CM

## 2023-06-23 DIAGNOSIS — Z7952 Long term (current) use of systemic steroids: Secondary | ICD-10-CM

## 2023-06-23 MED ORDER — SULFAMETHOXAZOLE-TRIMETHOPRIM 400-80 MG PO TABS: 1.0000 | ORAL_TABLET | ORAL | 2 refills | Status: DC

## 2023-06-23 MED ORDER — PREDNISONE 10 MG PO TABS: 30.0000 mg | ORAL_TABLET | Freq: Every day | ORAL | 3 refills | Status: DC

## 2023-06-23 NOTE — Patient Instructions (Addendum)
 Plan: -Blood work today. I will be in touch when I have your results  -Decrease prednisone  to 30 mg daily. I sent a new prescription for 10 mg tablets. You will take 3 tablets every morning.  -Take Bactrim  3 times a week (Monday, Wednesday, Friday) to prevent a type of pneumonia while you are on high dose steroids  -Vitamin D 1000 international units daily and calcium intake of [phone] mg/day (diet or supplemental) for bone health while on steroids. These can be bought over the counter  Follow up with your primary care doctor to monitor blood sugars and blood pressure while you are on steroids.  I need you to see your eye doctor at least once a year as well.  I will see you back in clinic in about 1 month.  Please let me know if you have any questions or concerns in the meantime.  Go to nearest emergency room if you have severe weakness, difficulty breathing or swallowing as this could be a myasthenic crisis (flare).  The physicians and staff at [facility] are committed to providing excellent care. You may receive a survey requesting feedback about your experience at our office. We strive to receive "very good" responses to the survey questions. If you feel that your experience would prevent you from giving the office a "very good " response, please contact our office to try to remedy the situation. We may be reached at [phone]. Thank you for taking the time out of your busy day to complete the survey.  Rommie Coats, MD [facility]

## 2023-06-27 ENCOUNTER — Ambulatory Visit
Admission: RE | Admit: 2023-06-27 | Discharge: 2023-06-27 | Disposition: A | Discharge status: Home / Self Care | Source: Ambulatory Visit | Attending: Neurology | Admitting: Neurology

## 2023-06-27 DIAGNOSIS — G7000 Myasthenia gravis without (acute) exacerbation: Secondary | ICD-10-CM

## 2023-06-27 LAB — QUANTIFERON-TB GOLD PLUS
QuantiFERON Nil Value: 0 [IU]/mL
QuantiFERON TB1 Ag Value: 0 [IU]/mL
QuantiFERON TB2 Ag Value: 0 [IU]/mL

## 2023-06-27 LAB — VARICELLA ZOSTER ANTIBODY, IGG: Varicella zoster IgG: REACTIVE

## 2023-06-27 LAB — JC VIRUS DNA,PCR (WHOLE BLOOD)

## 2023-06-27 MED ORDER — IOPAMIDOL (ISOVUE-300) INJECTION 61%
75.0000 mL | Freq: Once | INTRAVENOUS | Status: AC | PRN
  Administered 2023-06-27: 75 mL via INTRAVENOUS

## 2023-06-28 ENCOUNTER — Telehealth: Payer: Self-pay | Admitting: Neurology

## 2023-06-28 NOTE — Telephone Encounter (Signed)
 Pt picked up medication 06/27/23   Prednisone  20mg  and 10mg    She is confused as to what she needs to take. She has not took any medication yet, waiting on our call.  She went and got her lungs checked yesterday and she would like to know if we got the results of that yet.

## 2023-06-28 NOTE — Telephone Encounter (Signed)
 Called pt and let her know of Dr. Genita Keys orders for her prednisone  30 mg a day. She understood and would like to know the results of lung test when they return.

## 2023-06-30 ENCOUNTER — Ambulatory Visit: Payer: Self-pay | Admitting: Neurology

## 2023-07-01 ENCOUNTER — Telehealth: Payer: Self-pay | Admitting: Neurology

## 2023-07-01 NOTE — Telephone Encounter (Signed)
 Pt. Calling and saying after taking Rx prednisone   started having double vision yesterday, would like to know if this is normal

## 2023-07-07 NOTE — Telephone Encounter (Signed)
 Jenna Blair
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
## 2023-07-25 ENCOUNTER — Telehealth: Payer: Self-pay | Admitting: Neurology

## 2023-07-25 NOTE — Telephone Encounter (Signed)
 Pt called in stating she is almost finished with her antibiotic, but her speech is starting to slur a little again and her double vision is happening a little more frequently.

## 2023-07-26 ENCOUNTER — Telehealth: Payer: Self-pay | Admitting: Neurology

## 2023-07-26 DIAGNOSIS — G7000 Myasthenia gravis without (acute) exacerbation: Secondary | ICD-10-CM

## 2023-07-26 DIAGNOSIS — T380X5A Adverse effect of glucocorticoids and synthetic analogues, initial encounter: Secondary | ICD-10-CM

## 2023-07-26 MED ORDER — SULFAMETHOXAZOLE-TRIMETHOPRIM 400-80 MG PO TABS: 1.0000 | ORAL_TABLET | ORAL | 5 refills | Status: DC

## 2023-07-26 MED ORDER — PREDNISONE 10 MG PO TABS: 40.0000 mg | ORAL_TABLET | Freq: Every day | ORAL | 3 refills | Status: DC

## 2023-07-26 NOTE — Telephone Encounter (Signed)
 Patient called saying her dysarthria and diplopia was worsening. I returned her call and could hear her dysarthria which was mild but present worse than prior. I asked her to increase her prednisone  back to 40 mg daily, which she will do. I also refilled her bactrim  ppx.   The plan will be to start rituximab, however her hepatitis and HIV panel was not drawn at last visit. I need to be sure she is immune to hep B prior to starting rituximab. She will go to [facility] this week to have drawn.  I will see her next week on 08/02/23 as planned. We discussed warning signs of MG crisis and when to go to ED.  All questions were answered.  Jenna Potters, MD [facility]

## 2023-08-01 ENCOUNTER — Other Ambulatory Visit: Payer: Self-pay

## 2023-08-01 DIAGNOSIS — D84821 Immunodeficiency due to drugs: Secondary | ICD-10-CM

## 2023-08-01 DIAGNOSIS — H532 Diplopia: Secondary | ICD-10-CM

## 2023-08-01 DIAGNOSIS — H02403 Unspecified ptosis of bilateral eyelids: Secondary | ICD-10-CM

## 2023-08-01 DIAGNOSIS — G7000 Myasthenia gravis without (acute) exacerbation: Secondary | ICD-10-CM

## 2023-08-01 DIAGNOSIS — M6281 Muscle weakness (generalized): Secondary | ICD-10-CM

## 2023-08-01 DIAGNOSIS — R531 Weakness: Secondary | ICD-10-CM

## 2023-08-01 DIAGNOSIS — R471 Dysarthria and anarthria: Secondary | ICD-10-CM

## 2023-08-01 DIAGNOSIS — R1310 Dysphagia, unspecified: Secondary | ICD-10-CM

## 2023-08-02 ENCOUNTER — Encounter: Payer: Self-pay | Admitting: Neurology

## 2023-08-02 ENCOUNTER — Telehealth: Payer: Self-pay

## 2023-08-02 ENCOUNTER — Ambulatory Visit: Admitting: Neurology

## 2023-08-02 VITALS — BP 135/94 | HR 93 | Ht 60.0 in | Wt 226.0 lb

## 2023-08-02 DIAGNOSIS — Z7952 Long term (current) use of systemic steroids: Secondary | ICD-10-CM

## 2023-08-02 DIAGNOSIS — R1310 Dysphagia, unspecified: Secondary | ICD-10-CM

## 2023-08-02 DIAGNOSIS — D84821 Immunodeficiency due to drugs: Secondary | ICD-10-CM | POA: Diagnosis not present

## 2023-08-02 DIAGNOSIS — H532 Diplopia: Secondary | ICD-10-CM

## 2023-08-02 DIAGNOSIS — M6281 Muscle weakness (generalized): Secondary | ICD-10-CM

## 2023-08-02 DIAGNOSIS — T380X5A Adverse effect of glucocorticoids and synthetic analogues, initial encounter: Secondary | ICD-10-CM

## 2023-08-02 DIAGNOSIS — G7000 Myasthenia gravis without (acute) exacerbation: Secondary | ICD-10-CM | POA: Diagnosis not present

## 2023-08-02 DIAGNOSIS — R0601 Orthopnea: Secondary | ICD-10-CM

## 2023-08-02 DIAGNOSIS — H02403 Unspecified ptosis of bilateral eyelids: Secondary | ICD-10-CM

## 2023-08-02 DIAGNOSIS — R471 Dysarthria and anarthria: Secondary | ICD-10-CM

## 2023-08-02 LAB — HEPB+HEPC+HIV PANEL
HIV Screen 4th Generation wRfx: NONREACTIVE
Hep B C IgM: NEGATIVE
Hep B Core Total Ab: NEGATIVE
Hep B E Ab: NONREACTIVE
Hep B E Ag: NEGATIVE
Hep B Surface Ab, Qual: NONREACTIVE
Hep C Virus Ab: NONREACTIVE
Hepatitis B Surface Ag: NEGATIVE

## 2023-08-02 NOTE — Patient Instructions (Addendum)
 Continue prednisone  40 mg daily for now.  TMP-SMX 1 tablet 3 times per week  Vitamin D 1000 international units daily and calcium intake of 1000-1200 mg/day (diet or supplemental) for bone health while on steroids   We will likely start an infusion called Rituximab after your second hepatitis B vaccine (in about 1-2 months). That will help us  lower your steroids.  I will call your primary care to make sure they can get you the hepatitis B vaccine.  I will see you back in clinic in about 2 months.  Go to nearest emergency room if you have severe weakness, difficulty breathing or swallowing as this could be a myasthenic crisis (flare).   The physicians and staff at [facility] are committed to providing excellent care. You may receive a survey requesting feedback about your experience at our office. We strive to receive very good responses to the survey questions. If you feel that your experience would prevent you from giving the office a very good  response, please contact our office to try to remedy the situation. We may be reached at [phone]. Thank you for taking the time out of your busy day to complete the survey.  Venetia Potters, MD [facility]

## 2023-08-02 NOTE — Telephone Encounter (Signed)
 [facility] and was unable to reach them on hold for 25 mins. Left number to be called back and I faxed over a note for Dr. Austin. That pt needs a Hep B vaccine.

## 2023-08-04 ENCOUNTER — Telehealth: Payer: Self-pay

## 2023-08-05 NOTE — Telephone Encounter (Signed)
 [facility] called and they had talked to the pt and she did not know what type of  injection she needed. I told them she needed Hep B vaccine. They are going to call her.

## 2023-08-05 NOTE — Telephone Encounter (Signed)
 AfterHours Message: Pt states we have been trying to reach her PCP, please try again

## 2023-08-09 ENCOUNTER — Telehealth: Payer: Self-pay | Admitting: Neurology

## 2023-08-09 NOTE — Telephone Encounter (Signed)
 Pt came in and dropped off a return to work form for Dr. Leigh to sign. The pt would like to come in and pick it up once it is completed.  Form is in Dr. Loralee box

## 2023-08-09 NOTE — Telephone Encounter (Signed)
 Left a message with the after hour service on 08-09-23   Caller states that she needs her provider to sign a form for her to go back to work

## 2023-08-10 ENCOUNTER — Telehealth: Payer: Self-pay | Admitting: Neurology
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
## 2023-08-11 NOTE — Telephone Encounter (Signed)
 Pt. Would like to discuss again with more detail about DOT medical form

## 2023-08-12 NOTE — Telephone Encounter (Signed)
 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 She is concerned about her financial situation. I told her it is a liability for her and the people on the bus for her to drive. She said she understood and will be her Monday for the paper work.

## 2023-08-18 NOTE — Telephone Encounter (Signed)
 Pt. Would like to discuss again with more detail about DOT medical form as Job is asking more clarification

## 2023-08-19 NOTE — Telephone Encounter (Signed)
 Called pt and her company wanted to know how long she would not be able to drive. I have spoke with this pt before about this. Per Dr. Leigh she should not drive until she has all of her medical issues at a safe point. We are not able to tell her when that will be. She said  so all I can do is sub and drive myself and kids around I told her Dr. Leigh does not recommend you drive at all due to the danger of having double vision. It is a danger to herself and to others. She got upset and said  How am I to make a living and pay my bills. I told her all I can do is tell her what the recommendations . I understand the financial issue but all Dr. Leigh can do is  care fer her as a patient.

## 2023-08-26 ENCOUNTER — Telehealth: Payer: Self-pay | Admitting: Neurology

## 2023-08-26 ENCOUNTER — Other Ambulatory Visit: Payer: Self-pay | Admitting: Neurology

## 2023-08-26 DIAGNOSIS — G7000 Myasthenia gravis without (acute) exacerbation: Secondary | ICD-10-CM

## 2023-08-26 MED ORDER — PREDNISONE 10 MG PO TABS: 40.0000 mg | ORAL_TABLET | Freq: Every day | ORAL | 2 refills | Status: DC

## 2023-08-26 NOTE — Telephone Encounter (Signed)
 Called pharmacy and prescription is ready and called pt and let her know. She understood.

## 2023-08-26 NOTE — Telephone Encounter (Signed)
 Pt called this morning and she stated that she ran out of her medicines  this morning and she needs a refill on both of her medications. Send medications to [facility] on [address] in [place]. [place]

## 2023-09-05 ENCOUNTER — Telehealth: Payer: Self-pay | Admitting: Neurology

## 2023-09-05 NOTE — Telephone Encounter (Signed)
 Pt. Calling to provide she gets her 2nd shot on Sunday, no other explanation in Voicemail

## 2023-09-06 NOTE — Telephone Encounter (Signed)
 Pt. wants Dr. Leigh to know 2nd TB shot will be administered Saturday.

## 2023-09-19 ENCOUNTER — Telehealth: Payer: Self-pay | Admitting: Neurology

## 2023-09-19 ENCOUNTER — Encounter: Payer: Self-pay | Admitting: Neurology

## 2023-09-19 ENCOUNTER — Other Ambulatory Visit: Payer: Self-pay | Admitting: Neurology

## 2023-09-19 DIAGNOSIS — G7000 Myasthenia gravis without (acute) exacerbation: Secondary | ICD-10-CM

## 2023-09-19 HISTORY — DX: Myasthenia gravis without (acute) exacerbation: G70.00

## 2023-09-19 NOTE — Telephone Encounter (Signed)
 Pt called and states that she got her 2nd shout and wants to know the next steps please call

## 2023-09-19 NOTE — Telephone Encounter (Signed)
 Returned patient's call. She received her second hep B vaccine on 09/12/23. She is still in agreement to start Rituximab as I have not been able to wean her from prednisone  40 mg daily without significant symptom recurrence (dysarthria, dysphagia, diplopia, ptosis). I have ordered Rituximab 1 g every 2 weeks for 2 doses with the plan to redose about every 6 months. I would like her to start this no earlier than 2 weeks after last hep B shot (after 09/26/23). She currently denies any significant MG symptoms on prednisone  40 mg daily. She will be following up as planned on 09/30/23. All questions were answered.  Venetia Potters, MD [facility]

## 2023-09-20 ENCOUNTER — Encounter: Payer: Self-pay | Admitting: Neurology
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
## 2023-09-30 ENCOUNTER — Encounter: Payer: Self-pay | Admitting: Neurology

## 2023-09-30 ENCOUNTER — Ambulatory Visit: Admitting: Neurology

## 2023-09-30 VITALS — BP 142/92 | HR 108 | Ht 60.0 in | Wt 242.0 lb

## 2023-09-30 DIAGNOSIS — Z7952 Long term (current) use of systemic steroids: Secondary | ICD-10-CM | POA: Diagnosis not present

## 2023-09-30 DIAGNOSIS — T380X5A Adverse effect of glucocorticoids and synthetic analogues, initial encounter: Secondary | ICD-10-CM

## 2023-09-30 DIAGNOSIS — D84821 Immunodeficiency due to drugs: Secondary | ICD-10-CM

## 2023-09-30 DIAGNOSIS — G7000 Myasthenia gravis without (acute) exacerbation: Secondary | ICD-10-CM | POA: Diagnosis not present

## 2023-09-30 NOTE — Patient Instructions (Addendum)
 Continue to hold off on driving for another 1-2 months while we get your symptoms under control. We will evaluate you again next month after you start Rituximab.  -Finish hep B vaccine series as planned -Continue prednisone  40 mg daily -Orders for Rituximab placed. Awaiting insurance approval. Will plan for 1 g every 2 weeks x2; then every 6 months   Ppx while on steroids: -PJP ppx: TMP-SMX 1 tablet 3 times per week -Vit D 1000 international units daily and calcium intake of 1000-1200 mg/day (diet or supplemental) for bone health while on steroids  -Discussed glycemic control and following with PCP -Blood pressure should be monitored and managed with PCP -Ophthalmological examination is recommended at least annually due to concerns for cataracts or glaucoma  Follow up with me in clinic on 11/03/23 at 11:30 am.  Go to nearest emergency room if you have severe weakness, difficulty breathing or swallowing as this could be a myasthenic crisis (flare).  The physicians and staff at [facility] are committed to providing excellent care. You may receive a survey requesting feedback about your experience at our office. We strive to receive very good responses to the survey questions. If you feel that your experience would prevent you from giving the office a very good  response, please contact our office to try to remedy the situation. We may be reached at [phone]. Thank you for taking the time out of your busy day to complete the survey.  Venetia Potters, MD [facility]

## 2023-10-05 ENCOUNTER — Telehealth: Payer: Self-pay | Admitting: Pharmacy Technician

## 2023-10-05 NOTE — Telephone Encounter (Signed)
 Ruxience f/u: Auth was faxed 09/28/23 and still in pending status.  I have requested the auth be reviewed as URGENT. We will have a determination within 24 - 48/hrs

## 2023-10-10 ENCOUNTER — Other Ambulatory Visit (HOSPITAL_COMMUNITY): Payer: Self-pay | Admitting: Neurology

## 2023-10-10 ENCOUNTER — Telehealth: Payer: Self-pay | Admitting: Pharmacy Technician

## 2023-10-10 NOTE — Telephone Encounter (Signed)
 Ruxience has been approved. Patient will be scheduled as soon as possible.  Auth Submission: APPROVED Site of care: Site of care: [facility] Payer: [employer] Medication & CPT/J Code(s) submitted: Ruxience (Rituximab-pvvr) [id number] Diagnosis Code:  Route of submission (phone, fax, portal):  Phone # Fax # Auth type: Buy/Bill PB Units/visits requested: X2 DOSES Reference number: [id number]  Approval from: 09/29/23 to 01/25/24    Co-pay card pending; [employer] aware

## 2023-10-12 ENCOUNTER — Telehealth: Payer: Self-pay | Admitting: Neurology

## 2023-10-12 NOTE — Telephone Encounter (Signed)
 Pt left a message  on the Vm she needs to speak to someone about mediation on the days of her treatment on 10-26-23 and 11-11-23

## 2023-10-13 NOTE — Telephone Encounter (Signed)
 Patient has appointment on 10/26/2023 for infusion

## 2023-10-13 NOTE — Telephone Encounter (Signed)
 Called pt and informed her of Dr. Leigh answer. She understood.

## 2023-10-13 NOTE — Telephone Encounter (Signed)
 Called pt to answer questions no answer left message to call office.

## 2023-10-14 ENCOUNTER — Telehealth: Payer: Self-pay

## 2023-10-14 NOTE — Telephone Encounter (Signed)
 Called and left message on voice mail that Doctor Hill can't sign off on allowing her to drive at this time. She can come by and pick up the paper she left or I can mail it to her. Please call office.

## 2023-10-14 NOTE — Telephone Encounter (Signed)
 Caller called and LM with AN. She needs to have the doctor release DL to drive her car not CDL license.

## 2023-10-17 NOTE — Telephone Encounter (Signed)
 Called Pt on Friday 10/14/2023 around 3 pm and informed her that Per Dr. Leigh he does not release her to drive even her car due to the risk

## 2023-10-26 ENCOUNTER — Ambulatory Visit (INDEPENDENT_AMBULATORY_CARE_PROVIDER_SITE_OTHER)

## 2023-10-26 VITALS — BP 129/80 | HR 109 | Temp 98.5°F | Resp 18 | Ht 60.0 in | Wt 249.6 lb

## 2023-10-26 DIAGNOSIS — G7000 Myasthenia gravis without (acute) exacerbation: Secondary | ICD-10-CM

## 2023-10-26 MED ORDER — SODIUM CHLORIDE 0.9 % IV SOLN
1000.0000 mg | Freq: Once | INTRAVENOUS | Status: AC
  Administered 2023-10-26: 1000 mg via INTRAVENOUS
  Filled 2023-10-26: qty 100

## 2023-10-26 MED ORDER — ACETAMINOPHEN 325 MG PO TABS
650.0000 mg | ORAL_TABLET | Freq: Once | ORAL | Status: AC
  Administered 2023-10-26: 650 mg via ORAL
  Filled 2023-10-26: qty 2

## 2023-10-26 MED ORDER — METHYLPREDNISOLONE SODIUM SUCC 125 MG IJ SOLR
125.0000 mg | Freq: Once | INTRAMUSCULAR | Status: AC
  Administered 2023-10-26: 125 mg via INTRAVENOUS
  Filled 2023-10-26: qty 2

## 2023-10-26 MED ORDER — DIPHENHYDRAMINE HCL 25 MG PO CAPS
50.0000 mg | ORAL_CAPSULE | Freq: Once | ORAL | Status: AC
  Administered 2023-10-26: 50 mg via ORAL
  Filled 2023-10-26: qty 2

## 2023-10-26 NOTE — Progress Notes (Signed)
 Diagnosis: Myasthenia Gravis  Provider:  Mannam, Praveen MD  Procedure: IV Infusion  IV Type: Peripheral, IV Location: R Hand  Ruxience (rituximab-pvvr), Dose: 1000 mg  Infusion Start Time: 0934  Infusion Stop Time: 1408  Post Infusion IV Care: Observation period completed and Peripheral IV Discontinued  Discharge: Condition: Good, Destination: Home . AVS Provided  Performed by:  Rocky FORBES Sar, RN
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
## 2023-11-03 ENCOUNTER — Encounter: Payer: Self-pay | Admitting: Neurology

## 2023-11-03 ENCOUNTER — Ambulatory Visit: Admitting: Neurology

## 2023-11-03 VITALS — BP 126/84 | HR 112 | Ht 60.0 in | Wt 250.0 lb

## 2023-11-03 DIAGNOSIS — G7000 Myasthenia gravis without (acute) exacerbation: Secondary | ICD-10-CM

## 2023-11-03 DIAGNOSIS — D84821 Immunodeficiency due to drugs: Secondary | ICD-10-CM | POA: Diagnosis not present

## 2023-11-03 DIAGNOSIS — Z7952 Long term (current) use of systemic steroids: Secondary | ICD-10-CM | POA: Diagnosis not present

## 2023-11-03 MED ORDER — PREDNISONE 10 MG PO TABS: 30.0000 mg | ORAL_TABLET | Freq: Every day | ORAL | 5 refills | Status: DC

## 2023-11-03 NOTE — Patient Instructions (Addendum)
-  Reduce prednisone  to 30 mg daily (3 tablets)  -Continue rituximab (next dose 11/11/23) then again in 6 months  -Please call with new or worsening symptoms  Go to nearest emergency room if you have severe weakness, difficulty breathing or swallowing as this could be a myasthenic crisis (flare).  Please talk to your primary about making sure you are getting the final dose of the hepatitis B vaccine.  I will call you in mid November to discuss your symptoms. We may lower your steroids more at that time if you are still doing well.  The physicians and staff at [facility] are committed to providing excellent care. You may receive a survey requesting feedback about your experience at our office. We strive to receive very good responses to the survey questions. If you feel that your experience would prevent you from giving the office a very good  response, please contact our office to try to remedy the situation. We may be reached at [phone]. Thank you for taking the time out of your busy day to complete the survey.  Venetia Potters, MD [facility]

## 2023-11-11 ENCOUNTER — Ambulatory Visit (INDEPENDENT_AMBULATORY_CARE_PROVIDER_SITE_OTHER)

## 2023-11-11 VITALS — BP 128/85 | HR 90 | Temp 98.8°F | Resp 20 | Ht 60.0 in | Wt 254.4 lb

## 2023-11-11 DIAGNOSIS — G7000 Myasthenia gravis without (acute) exacerbation: Secondary | ICD-10-CM | POA: Diagnosis not present

## 2023-11-11 MED ORDER — METHYLPREDNISOLONE SODIUM SUCC 125 MG IJ SOLR
125.0000 mg | Freq: Once | INTRAMUSCULAR | Status: AC
  Administered 2023-11-11: 125 mg via INTRAVENOUS
  Filled 2023-11-11: qty 2

## 2023-11-11 MED ORDER — ACETAMINOPHEN 325 MG PO TABS
650.0000 mg | ORAL_TABLET | Freq: Once | ORAL | Status: AC
  Administered 2023-11-11: 650 mg via ORAL
  Filled 2023-11-11: qty 2

## 2023-11-11 MED ORDER — SODIUM CHLORIDE 0.9 % IV SOLN
1000.0000 mg | Freq: Once | INTRAVENOUS | Status: AC
  Administered 2023-11-11: 1000 mg via INTRAVENOUS
  Filled 2023-11-11: qty 100

## 2023-11-11 MED ORDER — DIPHENHYDRAMINE HCL 25 MG PO CAPS
50.0000 mg | ORAL_CAPSULE | Freq: Once | ORAL | Status: AC
  Administered 2023-11-11: 50 mg via ORAL
  Filled 2023-11-11: qty 2

## 2023-11-11 NOTE — Progress Notes (Signed)
 Diagnosis: Myasthenia Gravis  Provider:  Mannam, Praveen MD  Procedure: IV Infusion  IV Type: Peripheral, IV Location: R Hand  Ruxience (rituximab-pvvr), Dose: 1000 mg  Infusion Start Time: 0941  Infusion Stop Time: 1312  Post Infusion IV Care: Peripheral IV Discontinued  Discharge: Condition: Stable, Destination: Home . AVS Provided  Performed by:  Rocky FORBES Sar, RN

## 2023-11-16 ENCOUNTER — Telehealth: Payer: Self-pay | Admitting: Neurology

## 2023-11-16 NOTE — Telephone Encounter (Signed)
 Called pharmacy and pt does have refills and called pt and let her know

## 2023-11-16 NOTE — Telephone Encounter (Signed)
 Left  message with the after hour service on  11-15-23 5:54 pm  Caller states that she is on antibiotics that she takes 3 times a week and she is out of them. Caller states that she take it because she is on chronic steroids caller states she needs a refill   They did not get the name of medication or pharmacy
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
## 2023-12-07 ENCOUNTER — Ambulatory Visit (INDEPENDENT_AMBULATORY_CARE_PROVIDER_SITE_OTHER): Admitting: Neurology

## 2023-12-07 ENCOUNTER — Encounter: Payer: Self-pay | Admitting: Neurology

## 2023-12-07 ENCOUNTER — Other Ambulatory Visit

## 2023-12-07 ENCOUNTER — Other Ambulatory Visit: Payer: Self-pay | Admitting: Neurology

## 2023-12-07 ENCOUNTER — Other Ambulatory Visit (HOSPITAL_COMMUNITY): Payer: Self-pay | Admitting: Neurology

## 2023-12-07 VITALS — BP 138/88 | HR 80 | Wt 258.0 lb

## 2023-12-07 DIAGNOSIS — G7000 Myasthenia gravis without (acute) exacerbation: Secondary | ICD-10-CM

## 2023-12-07 MED ORDER — PREDNISONE 10 MG PO TABS: 20.0000 mg | ORAL_TABLET | Freq: Every day | ORAL | 5 refills | Status: DC

## 2023-12-07 NOTE — Patient Instructions (Addendum)
 I will get labs today.  We will decrease your prednisone  to 20 mg daily today.  I will check in with you around the end of the year to see how you are doing. If you are still doing well, we will likely decrease the prednisone  again.  Continue TMP-SMX 1 tablet 3 times per week.  Vit D 1000 international units daily and calcium intake of 1000-1200 mg/day (diet or supplemental) for bone health while on steroids   Continue to monitor your blood pressure and blood sugars while on steroids with your primary care.  Get an annual eye exam.  Discussed driving. Given you have no symptoms with a normal examination, I am okay with you starting to drive. You have promised that if you have any symptoms, including droopy eyelids, double vision, or weakness, even intermittently, you will not drive (stop immediately) and call our office.  Go to nearest emergency room if you have severe weakness, difficulty breathing or swallowing as this could be a myasthenic crisis (flare).  The physicians and staff at [facility] are committed to providing excellent care. You may receive a survey requesting feedback about your experience at our office. We strive to receive very good responses to the survey questions. If you feel that your experience would prevent you from giving the office a very good  response, please contact our office to try to remedy the situation. We may be reached at [phone]. Thank you for taking the time out of your busy day to complete the survey.  Venetia Potters, MD [facility]

## 2023-12-08 ENCOUNTER — Encounter: Payer: Self-pay | Admitting: Neurology

## 2023-12-08 LAB — COMPREHENSIVE METABOLIC PANEL WITH GFR
AG Ratio: 1.7 (calc) (ref 1.0–2.5)
ALT: 20 U/L (ref 6–29)
AST: 16 U/L (ref 10–30)
Albumin: 4.5 g/dL (ref 3.6–5.1)
Alkaline phosphatase (APISO): 52 U/L (ref 31–125)
BUN: 18 mg/dL (ref 7–25)
CO2: 27 mmol/L (ref 20–32)
Calcium: 9.8 mg/dL (ref 8.6–10.2)
Chloride: 103 mmol/L (ref 98–110)
Creat: 0.78 mg/dL (ref 0.50–0.97)
Globulin: 2.7 g/dL (ref 1.9–3.7)
Glucose, Bld: 110 mg/dL — ABNORMAL HIGH (ref 65–99)
Potassium: 4.4 mmol/L (ref 3.5–5.3)
Sodium: 140 mmol/L (ref 135–146)
Total Bilirubin: 0.3 mg/dL (ref 0.2–1.2)
Total Protein: 7.2 g/dL (ref 6.1–8.1)
eGFR: 100 mL/min/1.73m2 (ref >=60)

## 2023-12-08 LAB — CBC WITH DIFFERENTIAL/PLATELET
Absolute Lymphocytes: 1123 {cells}/uL (ref 850–3900)
Absolute Monocytes: 250 {cells}/uL (ref 200–950)
Basophils Absolute: 10 {cells}/uL (ref 0–200)
Basophils Relative: 0.1 %
Eosinophils Absolute: 10 {cells}/uL — ABNORMAL LOW (ref 15–500)
Eosinophils Relative: 0.1 %
HCT: 41.2 % (ref 35.0–45.0)
Hemoglobin: 13.7 g/dL (ref 11.7–15.5)
MCH: 30.8 pg (ref 27.0–33.0)
MCHC: 33.3 g/dL (ref 32.0–36.0)
MCV: 92.6 fL (ref 80.0–100.0)
MPV: 10.1 fL (ref 7.5–12.5)
Monocytes Relative: 2.4 %
Neutro Abs: 9006 {cells}/uL — ABNORMAL HIGH (ref 1500–7800)
Neutrophils Relative %: 86.6 %
Platelets: 292 Thousand/uL (ref 140–400)
RBC: 4.45 Million/uL (ref 3.80–5.10)
RDW: 13.1 % (ref 11.0–15.0)
Total Lymphocyte: 10.8 %
WBC: 10.4 Thousand/uL (ref 3.8–10.8)

## 2023-12-08 LAB — IGG, IGA, IGM
IgG (Immunoglobin G), Serum: 1087 mg/dL (ref 600–1640)
IgM, Serum: 81 mg/dL (ref 50–300)
Immunoglobulin A: 183 mg/dL (ref 47–310)

## 2023-12-08 LAB — IGE: IgE (Immunoglobulin E), Serum: 21 kU/L (ref <=114)

## 2023-12-09 ENCOUNTER — Ambulatory Visit: Payer: Self-pay | Admitting: Neurology

## 2023-12-26 ENCOUNTER — Other Ambulatory Visit: Payer: Self-pay

## 2023-12-26 ENCOUNTER — Telehealth: Payer: Self-pay | Admitting: Neurology

## 2023-12-26 DIAGNOSIS — Z7952 Long term (current) use of systemic steroids: Secondary | ICD-10-CM

## 2023-12-26 MED ORDER — SULFAMETHOXAZOLE-TRIMETHOPRIM 400-80 MG PO TABS: 1.0000 | ORAL_TABLET | ORAL | 5 refills | Status: DC

## 2023-12-26 NOTE — Telephone Encounter (Signed)
 Pt needs refill Rx sulfamethoxazole -trimethoprim  (BACTRIM ) 400-80 MG tablet at [id number] - [place], [place] - [address]

## 2023-12-26 NOTE — Telephone Encounter (Signed)
 Called pt to inform med was refilled .SABRA No more questions or concerns

## 2024-01-24 ENCOUNTER — Telehealth: Payer: Self-pay | Admitting: Neurology

## 2024-01-24 DIAGNOSIS — G7000 Myasthenia gravis without (acute) exacerbation: Secondary | ICD-10-CM

## 2024-01-24 MED ORDER — PREDNISONE 10 MG PO TABS: 10.0000 mg | ORAL_TABLET | Freq: Every day | ORAL | 5 refills | Status: AC

## 2024-01-24 NOTE — Telephone Encounter (Signed)
 Called patient to check on her MG symptoms.  Current MG symptoms: Ptosis: none Double vision: none Speech: none Chewing: none Swallowing: none Breathing: none Arm strength: none Leg strength: none  Current medications:  -Prednisone  20 mg daily -Rituximab  every 6 months (05/11/24)  Ppx: -PJP ppx: TMP-SMX 1 tablet 3 times per week -Vit D 1000 international units daily and calcium intake of 1000-1200 mg/day (diet or supplemental) for bone health while on steroids    Given the continued minimal symptoms, we agreed to decrease her prednisone  to 10 mg daily today. We can also stop her TMP-SMX PJP ppx. She will continue vit D and calcium supplementation.  She will follow up as planned on 04/12/24.  She will call with any new or worsening symptoms.  Venetia Potters, MD [facility]

## 2024-02-28 ENCOUNTER — Ambulatory Visit: Admitting: Neurology

## 2024-04-12 ENCOUNTER — Ambulatory Visit: Admitting: Neurology

## 2024-05-11 ENCOUNTER — Ambulatory Visit

## 2024-05-25 ENCOUNTER — Ambulatory Visit
